# Patient Record
Sex: Female | Born: 1950 | Race: White | Hispanic: No | Marital: Single | Smoking: Former smoker
Health system: Southern US, Community
[De-identification: ages and names within clinical notes are randomized; demographics above are authoritative.]

## PROBLEM LIST (undated history)

## (undated) DIAGNOSIS — S069XAA Unspecified intracranial injury with loss of consciousness status unknown, initial encounter: Secondary | ICD-10-CM

## (undated) DIAGNOSIS — R479 Unspecified speech disturbances: Secondary | ICD-10-CM

## (undated) DIAGNOSIS — S069X9A Unspecified intracranial injury with loss of consciousness of unspecified duration, initial encounter: Secondary | ICD-10-CM

## (undated) HISTORY — PX: SPLENECTOMY, TOTAL: SHX788

---

## 1990-07-27 HISTORY — PX: BREAST EXCISIONAL BIOPSY: SUR124

## 2004-08-01 ENCOUNTER — Emergency Department: Payer: Self-pay | Admitting: Internal Medicine

## 2004-08-25 ENCOUNTER — Ambulatory Visit: Payer: Self-pay | Admitting: Internal Medicine

## 2005-10-21 ENCOUNTER — Ambulatory Visit: Payer: Self-pay | Admitting: Internal Medicine

## 2006-10-25 ENCOUNTER — Ambulatory Visit: Payer: Self-pay | Admitting: Internal Medicine

## 2007-11-03 ENCOUNTER — Ambulatory Visit: Payer: Self-pay | Admitting: Internal Medicine

## 2008-11-28 ENCOUNTER — Ambulatory Visit: Payer: Self-pay | Admitting: Internal Medicine

## 2009-12-24 ENCOUNTER — Ambulatory Visit: Payer: Self-pay | Admitting: Internal Medicine

## 2011-03-03 ENCOUNTER — Ambulatory Visit: Payer: Self-pay | Admitting: Internal Medicine

## 2011-08-15 ENCOUNTER — Emergency Department: Payer: Self-pay | Admitting: Emergency Medicine

## 2012-03-09 ENCOUNTER — Ambulatory Visit: Payer: Self-pay | Admitting: Internal Medicine

## 2013-03-21 ENCOUNTER — Ambulatory Visit: Payer: Self-pay | Admitting: Internal Medicine

## 2014-01-09 ENCOUNTER — Observation Stay: Payer: Self-pay | Admitting: Internal Medicine

## 2014-01-09 LAB — DRUG SCREEN, URINE
Amphetamines, Ur Screen: NEGATIVE (ref ?–1000)
BARBITURATES, UR SCREEN: NEGATIVE (ref ?–200)
Benzodiazepine, Ur Scrn: POSITIVE (ref ?–200)
CANNABINOID 50 NG, UR ~~LOC~~: NEGATIVE (ref ?–50)
COCAINE METABOLITE, UR ~~LOC~~: NEGATIVE (ref ?–300)
MDMA (ECSTASY) UR SCREEN: NEGATIVE (ref ?–500)
Methadone, Ur Screen: NEGATIVE (ref ?–300)
Opiate, Ur Screen: NEGATIVE (ref ?–300)
PHENCYCLIDINE (PCP) UR S: NEGATIVE (ref ?–25)
TRICYCLIC, UR SCREEN: NEGATIVE (ref ?–1000)

## 2014-01-09 LAB — COMPREHENSIVE METABOLIC PANEL
ALK PHOS: 70 U/L
ALT: 30 U/L (ref 12–78)
Albumin: 3.6 g/dL (ref 3.4–5.0)
Anion Gap: 8 (ref 7–16)
BUN: 11 mg/dL (ref 7–18)
Bilirubin,Total: 0.5 mg/dL (ref 0.2–1.0)
CALCIUM: 8.9 mg/dL (ref 8.5–10.1)
CHLORIDE: 105 mmol/L (ref 98–107)
Co2: 21 mmol/L (ref 21–32)
Creatinine: 0.76 mg/dL (ref 0.60–1.30)
EGFR (Non-African Amer.): >60
GLUCOSE: 86 mg/dL (ref 65–99)
Osmolality: 267 (ref 275–301)
POTASSIUM: 4.2 mmol/L (ref 3.5–5.1)
SGOT(AST): 42 U/L — ABNORMAL HIGH (ref 15–37)
Sodium: 134 mmol/L — ABNORMAL LOW (ref 136–145)
Total Protein: 7.7 g/dL (ref 6.4–8.2)

## 2014-01-09 LAB — TROPONIN I: Troponin-I: <0.02 ng/mL

## 2014-01-09 LAB — URINALYSIS, COMPLETE
BILIRUBIN, UR: NEGATIVE
Bacteria: NONE SEEN
Blood: NEGATIVE
GLUCOSE, UR: NEGATIVE mg/dL (ref 0–75)
KETONE: NEGATIVE
Leukocyte Esterase: NEGATIVE
Nitrite: NEGATIVE
Ph: 7 (ref 4.5–8.0)
Protein: NEGATIVE
RBC,UR: <1 /HPF (ref 0–5)
Specific Gravity: 1.008 (ref 1.003–1.030)
Squamous Epithelial: 1
WBC UR: <1 /HPF (ref 0–5)

## 2014-01-09 LAB — CBC
HCT: 44.7 % (ref 35.0–47.0)
HGB: 14.5 g/dL (ref 12.0–16.0)
MCH: 31.6 pg (ref 26.0–34.0)
MCHC: 32.5 g/dL (ref 32.0–36.0)
MCV: 97 fL (ref 80–100)
PLATELETS: 303 10*3/uL (ref 150–440)
RBC: 4.6 10*6/uL (ref 3.80–5.20)
RDW: 13.4 % (ref 11.5–14.5)
WBC: 9.7 10*3/uL (ref 3.6–11.0)

## 2014-01-10 LAB — CBC WITH DIFFERENTIAL/PLATELET
Basophil #: 0.1 10*3/uL (ref 0.0–0.1)
Basophil %: 0.9 %
Eosinophil #: 0.2 10*3/uL (ref 0.0–0.7)
Eosinophil %: 2.9 %
HCT: 41.9 % (ref 35.0–47.0)
HGB: 14.5 g/dL (ref 12.0–16.0)
Lymphocyte #: 3.4 10*3/uL (ref 1.0–3.6)
Lymphocyte %: 40.8 %
MCH: 33.8 pg (ref 26.0–34.0)
MCHC: 34.6 g/dL (ref 32.0–36.0)
MCV: 98 fL (ref 80–100)
MONO ABS: 0.9 x10 3/mm (ref 0.2–0.9)
Monocyte %: 10.4 %
Neutrophil #: 3.8 10*3/uL (ref 1.4–6.5)
Neutrophil %: 45 %
Platelet: 268 10*3/uL (ref 150–440)
RBC: 4.29 10*6/uL (ref 3.80–5.20)
RDW: 13.3 % (ref 11.5–14.5)
WBC: 8.4 10*3/uL (ref 3.6–11.0)

## 2014-01-10 LAB — MAGNESIUM: Magnesium: 2.2 mg/dL

## 2014-01-10 LAB — BASIC METABOLIC PANEL
ANION GAP: 6 — AB (ref 7–16)
BUN: 12 mg/dL (ref 7–18)
CALCIUM: 9.8 mg/dL (ref 8.5–10.1)
CHLORIDE: 104 mmol/L (ref 98–107)
Co2: 29 mmol/L (ref 21–32)
Creatinine: 0.83 mg/dL (ref 0.60–1.30)
EGFR (African American): >60
EGFR (Non-African Amer.): >60
Glucose: 76 mg/dL (ref 65–99)
Osmolality: 276 (ref 275–301)
Potassium: 4.1 mmol/L (ref 3.5–5.1)
SODIUM: 139 mmol/L (ref 136–145)

## 2014-01-10 LAB — LIPID PANEL
Cholesterol: 225 mg/dL — ABNORMAL HIGH (ref 0–200)
HDL Cholesterol: 76 mg/dL — ABNORMAL HIGH (ref 40–60)
Ldl Cholesterol, Calc: 133 mg/dL — ABNORMAL HIGH (ref 0–100)
TRIGLYCERIDES: 82 mg/dL (ref 0–200)
VLDL CHOLESTEROL, CALC: 16 mg/dL (ref 5–40)

## 2014-03-13 ENCOUNTER — Ambulatory Visit: Payer: Self-pay | Admitting: Otolaryngology

## 2014-04-10 ENCOUNTER — Ambulatory Visit: Payer: Self-pay | Admitting: Internal Medicine
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
## 2014-12-07 ENCOUNTER — Encounter: Payer: Self-pay | Admitting: *Deleted

## 2014-12-07 ENCOUNTER — Emergency Department
Admission: EM | Admit: 2014-12-07 | Discharge: 2014-12-07 | Disposition: A | Discharge status: Home / Self Care | Payer: Medicare Other | Attending: Emergency Medicine | Admitting: Emergency Medicine

## 2014-12-07 ENCOUNTER — Emergency Department: Payer: Medicare Other

## 2014-12-07 DIAGNOSIS — J189 Pneumonia, unspecified organism: Secondary | ICD-10-CM

## 2014-12-07 DIAGNOSIS — R079 Chest pain, unspecified: Secondary | ICD-10-CM | POA: Diagnosis present

## 2014-12-07 DIAGNOSIS — J159 Unspecified bacterial pneumonia: Secondary | ICD-10-CM | POA: Insufficient documentation

## 2014-12-07 DIAGNOSIS — Z87891 Personal history of nicotine dependence: Secondary | ICD-10-CM | POA: Diagnosis not present

## 2014-12-07 DIAGNOSIS — D72829 Elevated white blood cell count, unspecified: Secondary | ICD-10-CM | POA: Diagnosis not present

## 2014-12-07 HISTORY — DX: Unspecified speech disturbances: R47.9

## 2014-12-07 LAB — COMPREHENSIVE METABOLIC PANEL
ALT: 18 U/L (ref 14–54)
ANION GAP: 9 (ref 5–15)
AST: 28 U/L (ref 15–41)
Albumin: 4 g/dL (ref 3.5–5.0)
Alkaline Phosphatase: 72 U/L (ref 38–126)
BUN: 11 mg/dL (ref 6–20)
CHLORIDE: 98 mmol/L — AB (ref 101–111)
CO2: 31 mmol/L (ref 22–32)
Calcium: 9.6 mg/dL (ref 8.9–10.3)
Creatinine, Ser: 0.67 mg/dL (ref 0.44–1.00)
Glucose, Bld: 101 mg/dL — ABNORMAL HIGH (ref 65–99)
POTASSIUM: 3.7 mmol/L (ref 3.5–5.1)
Sodium: 138 mmol/L (ref 135–145)
Total Bilirubin: 0.4 mg/dL (ref 0.3–1.2)
Total Protein: 8.1 g/dL (ref 6.5–8.1)

## 2014-12-07 LAB — CBC
HCT: 40.9 % (ref 35.0–47.0)
Hemoglobin: 13.4 g/dL (ref 12.0–16.0)
MCH: 30.8 pg (ref 26.0–34.0)
MCHC: 32.8 g/dL (ref 32.0–36.0)
MCV: 94 fL (ref 80.0–100.0)
Platelets: 335 10*3/uL (ref 150–440)
RBC: 4.35 MIL/uL (ref 3.80–5.20)
RDW: 13.4 % (ref 11.5–14.5)
WBC: 13.3 10*3/uL — AB (ref 3.6–11.0)

## 2014-12-07 LAB — TROPONIN I

## 2014-12-07 MED ORDER — LEVOFLOXACIN 500 MG PO TABS: 500.0000 mg | ORAL_TABLET | Freq: Every day | ORAL | Status: AC

## 2014-12-07 MED ORDER — LEVOFLOXACIN 500 MG PO TABS
500.0000 mg | ORAL_TABLET | Freq: Once | ORAL | Status: AC
  Administered 2014-12-07: 500 mg via ORAL

## 2014-12-07 MED ORDER — LEVOFLOXACIN 500 MG PO TABS
ORAL_TABLET | ORAL | Status: AC
  Administered 2014-12-07: 500 mg via ORAL
  Filled 2014-12-07: qty 1

## 2014-12-07 NOTE — ED Notes (Signed)
Pt arrives via EMS from [facility] with complaints of chest pain, pt states the pain started under her right breast, pt deneis any SOB, nausea or cardiac hx

## 2014-12-07 NOTE — Discharge Instructions (Signed)

## 2014-12-07 NOTE — ED Provider Notes (Signed)
[facility] Emergency Department Provider Note  ____________________________________________  Time seen: 2:20 PM  I have reviewed the triage vital signs and the nursing notes.   HISTORY  Chief Complaint Chest Pain    HPI Brittany [name] is a [age] y.o. female who presents with complaints of right breast and chest discomfort since this morning. Notes the pain is mild and is aching in nature. she reports a distant history of smoking and does complain of a mild cough. No fevers chills. No shortness of breath. No radiation. She has never had this before.    Past Medical History  Diagnosis Date  . Speech impairment     There are no active problems to display for this patient.   Past Surgical History  Procedure Laterality Date  . Splenectomy, total      No current outpatient prescriptions on file.  Allergies Sulfa antibiotics  History reviewed. No pertinent family history.  Social History History  Substance Use Topics  . Smoking status: Former Games developermoker  . Smokeless tobacco: Not on file  . Alcohol Use: Not on file    Review of Systems  Constitutional: Negative for fever. Eyes: Negative for visual changes. ENT: Negative for sore throat. Cardiovascular: Positive for chest pain Respiratory: Negative for shortness of breath. Gastrointestinal: Negative for abdominal pain, vomiting and diarrhea. Genitourinary: Negative for dysuria. Musculoskeletal: Negative for back pain. Skin: Negative for rash. Neurological: Negative for headaches, focal weakness or numbness.   10-point ROS otherwise negative.  ____________________________________________   PHYSICAL EXAM:  VITAL SIGNS: ED Triage Vitals  Enc Vitals Group     BP 12/07/14 1407 114/69 mmHg     Pulse Rate 12/07/14 1407 101     Resp 12/07/14 1407 20     Temp 12/07/14 1407 98.7 F (37.1 C)     Temp Source 12/07/14 1407 Oral     SpO2 12/07/14 1353 93 %     Weight 12/07/14 1407 144 lb  (65.318 kg)     Height 12/07/14 1407 5\' 3"  (1.6 m)     Head Cir --      Peak Flow --      Pain Score 12/07/14 1410 6     Pain Loc --      Pain Edu? --      Excl. in GC? --      Constitutional: Alert and oriented. Well appearing and in no distress. Eyes: Conjunctivae are normal. PERRL. Normal extraocular movements. ENT   Head: Normocephalic and atraumatic.   Nose: No congestion/rhinnorhea.   Mouth/Throat: Mucous membranes are moist. Patient with chronic speech impairment   Neck: No stridor. Hematological/Lymphatic/Immunilogical: No cervical lymphadenopathy. Cardiovascular: Normal rate, regular rhythm. Normal and symmetric distal pulses are present in all extremities. No murmurs, rubs, or gallops. Respiratory: Normal respiratory effort without tachypnea nor retractions. Breath sounds are clear and equal bilaterally. No wheezes/rales/rhonchi. Gastrointestinal: Soft and nontender. No distention. There is no CVA tenderness. Genitourinary: deferred Musculoskeletal: Nontender with normal range of motion in all extremities. No joint effusions.  No lower extremity tenderness nor edema. Neurologic:  Normal speech and language. No gross focal neurologic deficits are appreciated. Speech is normal.  Skin:  Skin is warm, dry and intact. No rash noted. Psychiatric: Mood and affect are normal. Speech and behavior are normal. Patient exhibits appropriate insight and judgment.  ____________________________________________    LABS (pertinent positives/negatives)  Mildly elevated white blood cell count at 13.3 otherwise normal labs  ____________________________________________   EKG   Date: 12/07/2014  Rate: 96  Rhythm: normal sinus rhythm  QRS Axis: normal  Intervals: normal  ST/T Wave abnormalities: normal  Conduction Disutrbances: none  Narrative Interpretation: unremarkable      ____________________________________________    RADIOLOGY  Chest x-ray concerning for  right-sided pneumonia  ____________________________________________   PROCEDURES  Procedure(s) performed: None  Critical Care performed: None  ____________________________________________   INITIAL IMPRESSION / ASSESSMENT AND PLAN / ED COURSE  Pertinent labs & imaging results that were available during my care of the patient were reviewed by me and considered in my medical decision making (see chart for details).  Patient in no acute distress. Pleasant and reports pain is very mild. History of present illness not insistent with ACS given the location of pain and normal EKG. We will check labs as well.  ____________________________________________ ----------------------------------------- 4:06 PM on 12/07/2014 ----------------------------------------- X-ray consistent with right-sided pneumonia which is also the site where the patient is having discomfort. She is 94-95% and above on room air and she is not tachycardic. We will treat her with by mouth antibiotics. She is in assisted living and family reports they will check on her daily. She will follow-up with her PCP Dr. Hyacinth MeekerMiller. She knows to return if any shortness of breath or worsening symptoms   FINAL CLINICAL IMPRESSION(S) / ED DIAGNOSES  Final diagnoses:  Community acquired pneumonia     Jene Everyobert Orley Lawry, MD 12/07/14 1939

## 2015-01-22 ENCOUNTER — Other Ambulatory Visit: Payer: Self-pay | Admitting: Internal Medicine

## 2015-01-22 DIAGNOSIS — Z1231 Encounter for screening mammogram for malignant neoplasm of breast: Secondary | ICD-10-CM

## 2015-04-16 ENCOUNTER — Ambulatory Visit
Admission: RE | Admit: 2015-04-16 | Discharge: 2015-04-16 | Disposition: A | Discharge status: Home / Self Care | Payer: Medicare Other | Source: Ambulatory Visit | Attending: Internal Medicine | Admitting: Internal Medicine

## 2015-04-16 ENCOUNTER — Other Ambulatory Visit: Payer: Self-pay | Admitting: Internal Medicine

## 2015-04-16 DIAGNOSIS — Z1231 Encounter for screening mammogram for malignant neoplasm of breast: Secondary | ICD-10-CM

## 2016-03-25 ENCOUNTER — Other Ambulatory Visit: Payer: Self-pay | Admitting: Internal Medicine

## 2016-03-25 DIAGNOSIS — Z1231 Encounter for screening mammogram for malignant neoplasm of breast: Secondary | ICD-10-CM

## 2016-04-21 ENCOUNTER — Ambulatory Visit: Payer: Medicare Other

## 2016-05-04 ENCOUNTER — Other Ambulatory Visit: Payer: Self-pay | Admitting: Internal Medicine

## 2016-05-04 ENCOUNTER — Ambulatory Visit
Admission: RE | Admit: 2016-05-04 | Discharge: 2016-05-04 | Disposition: A | Discharge status: Home / Self Care | Payer: Medicare Other | Source: Ambulatory Visit | Attending: Internal Medicine | Admitting: Internal Medicine

## 2016-05-04 DIAGNOSIS — Z1231 Encounter for screening mammogram for malignant neoplasm of breast: Secondary | ICD-10-CM

## 2017-03-01 ENCOUNTER — Other Ambulatory Visit: Payer: Self-pay | Admitting: Psychiatry

## 2017-03-01 DIAGNOSIS — Z1231 Encounter for screening mammogram for malignant neoplasm of breast: Secondary | ICD-10-CM

## 2017-05-11 ENCOUNTER — Ambulatory Visit
Admission: RE | Admit: 2017-05-11 | Discharge: 2017-05-11 | Disposition: A | Discharge status: Home / Self Care | Payer: Medicare Other | Source: Ambulatory Visit | Attending: Psychiatry | Admitting: Psychiatry

## 2017-05-11 DIAGNOSIS — Z1231 Encounter for screening mammogram for malignant neoplasm of breast: Secondary | ICD-10-CM | POA: Diagnosis not present

## 2017-06-07 ENCOUNTER — Other Ambulatory Visit: Payer: Self-pay | Admitting: Otolaryngology

## 2017-06-07 DIAGNOSIS — R1312 Dysphagia, oropharyngeal phase: Secondary | ICD-10-CM

## 2017-06-28 ENCOUNTER — Ambulatory Visit
Admission: RE | Admit: 2017-06-28 | Discharge: 2017-06-28 | Disposition: A | Discharge status: Home / Self Care | Payer: Medicare Other | Source: Ambulatory Visit | Attending: Otolaryngology | Admitting: Otolaryngology

## 2017-06-28 DIAGNOSIS — R1312 Dysphagia, oropharyngeal phase: Secondary | ICD-10-CM | POA: Diagnosis not present

## 2017-06-28 NOTE — Therapy (Signed)
[facility] [address] [place], [place], [address] Phone: [phone]   Fax:     Modified Barium Swallow  Patient Details  Name: Brittany [name] MRN: [id number] Date of Birth: Dec 23, 1950 No Data Recorded  Encounter Date: 06/28/2017  End of Session - 06/28/17 1522    Visit Number  1    Number of Visits  1    Date for SLP Re-Evaluation  06/28/17    SLP Start Time  1222    SLP Stop Time   1315    SLP Time Calculation (min)  53 min    Activity Tolerance  Patient tolerated treatment well       Past Medical History:  Diagnosis Date  . Speech impairment     Past Surgical History:  Procedure Laterality Date  . BREAST EXCISIONAL BIOPSY Left 1992   neg  . SPLENECTOMY, TOTAL      There were no vitals filed for this visit.   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  She has been on thickened liquids (nectar-thick).  Chief complaint: Increased choking/coughing at meals.  The patient and her caregiver are not able to identify specific circumstances of the increase in choking/coughing, but seem to indicate more difficulty with foods than the thickened liquid.     Objective:  Radiological Procedure: A videoflouroscopic evaluation of oral-preparatory, reflex initiation, and pharyngeal phases of the swallow was performed; as well as a screening of the upper esophageal phase.  I. POSTURE: Upright in MBS chair  II. VIEW: Lateral  III. COMPENSATORY STRATEGIES: Dry, follow up swallow aids oral and pharyngeal clearance  IV. BOLUSES ADMINISTERED:   Thin Liquid: 1 teaspoon presentation   Nectar-thick Liquid: 2 self-administered cup rim sips   Honey-thick Liquid: DNT   Puree: 2 teaspoon presentations   Mechanical Soft: 1/4 graham cracker in applesauce  V. RESULTS OF  EVALUATION: A. ORAL PREPARATORY PHASE: (The lips, tongue, and velum are observed for strength and coordination)       **Overall Severity Rating: Moderate; slow disorganized lingual movement, mild oral residue post swallow (on back-to-base of tongue)  B. SWALLOW INITIATION/REFLEX: (The reflex is normal if "triggered" by the time the bolus reached the base of the tongue)  **Overall Severity Rating: Moderate-severe; triggers at the pyriform sinuses  C. PHARYNGEAL PHASE: (Pharyngeal function is normal if the bolus shows rapid, smooth, and continuous transit through the pharynx and there is no pharyngeal residue after the swallow)  **Overall Severity Rating: Moderate; decreased hyolaryngeal excursion, absent epiglottic inversion, reduced tongue base retraction, and mild vallecular residue post swallow.    D. LARYNGEAL PENETRATION: (Material entering into the laryngeal inlet/vestibule but not aspirated) None with solids or nectar-thick liquid  E. ASPIRATION: Immediate, audible aspiration of thin liquid, no observed aspiration of solids or nectar-thick liquid  F. ESOPHAGEAL PHASE: (Screening of the upper esophagus): Boluses appeared to move slowly through the cervical esophagus  ASSESSMENT: This 66 year old woman; S/P TBI in the 1980s with resultant oropharyngeal dysphagia and currently on thickened liquids; is presenting with moderate oropharyngeal dysphagia characterized by slow/disorganized oral transfer, oral residue post swallow, delayed pharyngeal swallow initiation, decreased hyolaryngeal excursion, absent epiglottic inversion, reduced tongue base retraction, and mild vallecular residue post swallow.  There is no observed laryngeal penetration or aspiration with nectar-thick liquid or solids.  She coughed X1 without observed airway compromise.  The  patient had frank, audible aspiration of thin liquid before a swallow was initiated.  This study supports a mechanical soft diet with nectar-thick liquid.   A cough is not conclusive for aspiration, as she coughed with a solid bolus despite no airway compromise.  The patient and her caregiver are not able to clearly state what correlates to coughing while eating her meals.  She may be taking too much at one time, getting distracted, need to alternate liquids / solids, need softer/moister foods, need to eat smaller, more frequent meals, etc.  Recommend that the patient have a clinical swallowing evaluation, in her usual setting, to determine appropriate diet consistency and swallowing guidelines.  PLAN/RECOMMENDATIONS:   A. Diet: Regular (soften and moisten as needed) with nectar-thick liquid   B. Swallowing Precautions: Reduce distraction, small bites/sips, alternate solids and liquids   C. Recommended consultation to GI, patient may have esophageal motility changes   D. Therapy recommendations: clinical swallow evaluation in her usual setting   E. Results and recommendations were with the patient and her caregiver immediately following the study and the final report routed to the referring MD and the patient's PCP.   Patient will benefit from skilled therapeutic intervention in order to improve the following deficits and impairments:   Oropharyngeal dysphagia - Plan: DG OP Swallowing Func-Medicare/Speech Path, DG OP Swallowing Func-Medicare/Speech Path  G-Codes - 06/28/17 1522    Functional Assessment Tool Used  MBSS, clinical judgment    Functional Limitations  Swallowing    Swallow Current Status (Z6109(G8996)  At least 40 percent but less than 60 percent impaired, limited or restricted    Swallow Goal Status (U0454(G8997)  At least 40 percent but less than 60 percent impaired, limited or restricted    Swallow Discharge Status [id number]([id number])  At least 40 percent but less than 60 percent impaired, limited or restricted           Problem List There are no active problems to display for this patient.  [name], MS/CCC- SLP  Leandrew KoyanagiAbernathy,  Susie 06/28/2017, 3:23 PM  [facility] [address] [place], [place], [address] Phone: [phone]   Fax:     Name: Brittany [name] MRN: [id number] Date of Birth: 10/26/1950

## 2018-03-13 ENCOUNTER — Inpatient Hospital Stay
Admission: EM | Admit: 2018-03-13 | Discharge: 2018-03-23 | DRG: 871 | Disposition: A | Discharge status: Home / Self Care | Payer: Medicare Other | Attending: Internal Medicine | Admitting: Internal Medicine

## 2018-03-13 ENCOUNTER — Emergency Department: Payer: Medicare Other

## 2018-03-13 ENCOUNTER — Inpatient Hospital Stay: Payer: Medicare Other

## 2018-03-13 ENCOUNTER — Other Ambulatory Visit: Payer: Self-pay

## 2018-03-13 DIAGNOSIS — Z8249 Family history of ischemic heart disease and other diseases of the circulatory system: Secondary | ICD-10-CM | POA: Diagnosis not present

## 2018-03-13 DIAGNOSIS — Z09 Encounter for follow-up examination after completed treatment for conditions other than malignant neoplasm: Secondary | ICD-10-CM

## 2018-03-13 DIAGNOSIS — J869 Pyothorax without fistula: Secondary | ICD-10-CM | POA: Diagnosis not present

## 2018-03-13 DIAGNOSIS — Z882 Allergy status to sulfonamides status: Secondary | ICD-10-CM | POA: Diagnosis not present

## 2018-03-13 DIAGNOSIS — Z9081 Acquired absence of spleen: Secondary | ICD-10-CM

## 2018-03-13 DIAGNOSIS — Z87891 Personal history of nicotine dependence: Secondary | ICD-10-CM | POA: Diagnosis not present

## 2018-03-13 DIAGNOSIS — G249 Dystonia, unspecified: Secondary | ICD-10-CM | POA: Diagnosis present

## 2018-03-13 DIAGNOSIS — R1310 Dysphagia, unspecified: Secondary | ICD-10-CM | POA: Diagnosis present

## 2018-03-13 DIAGNOSIS — A419 Sepsis, unspecified organism: Secondary | ICD-10-CM | POA: Diagnosis present

## 2018-03-13 DIAGNOSIS — G248 Other dystonia: Secondary | ICD-10-CM | POA: Diagnosis not present

## 2018-03-13 DIAGNOSIS — J181 Lobar pneumonia, unspecified organism: Secondary | ICD-10-CM | POA: Diagnosis present

## 2018-03-13 DIAGNOSIS — R479 Unspecified speech disturbances: Secondary | ICD-10-CM | POA: Diagnosis not present

## 2018-03-13 DIAGNOSIS — Z978 Presence of other specified devices: Secondary | ICD-10-CM | POA: Diagnosis not present

## 2018-03-13 DIAGNOSIS — J189 Pneumonia, unspecified organism: Secondary | ICD-10-CM

## 2018-03-13 DIAGNOSIS — Y95 Nosocomial condition: Secondary | ICD-10-CM | POA: Diagnosis present

## 2018-03-13 DIAGNOSIS — Z993 Dependence on wheelchair: Secondary | ICD-10-CM | POA: Diagnosis not present

## 2018-03-13 DIAGNOSIS — R0602 Shortness of breath: Secondary | ICD-10-CM | POA: Diagnosis present

## 2018-03-13 DIAGNOSIS — J90 Pleural effusion, not elsewhere classified: Secondary | ICD-10-CM | POA: Diagnosis not present

## 2018-03-13 DIAGNOSIS — R0902 Hypoxemia: Secondary | ICD-10-CM | POA: Diagnosis present

## 2018-03-13 DIAGNOSIS — Z87820 Personal history of traumatic brain injury: Secondary | ICD-10-CM | POA: Diagnosis not present

## 2018-03-13 HISTORY — DX: Unspecified intracranial injury with loss of consciousness of unspecified duration, initial encounter: S06.9X9A

## 2018-03-13 HISTORY — DX: Unspecified intracranial injury with loss of consciousness status unknown, initial encounter: S06.9XAA

## 2018-03-13 LAB — COMPREHENSIVE METABOLIC PANEL
ALBUMIN: 3.4 g/dL — AB (ref 3.5–5.0)
ALK PHOS: 96 U/L (ref 38–126)
ALT: 31 U/L (ref 0–44)
ANION GAP: 9 (ref 5–15)
AST: 37 U/L (ref 15–41)
BUN: 10 mg/dL (ref 8–23)
CALCIUM: 9.3 mg/dL (ref 8.9–10.3)
CO2: 27 mmol/L (ref 22–32)
Chloride: 103 mmol/L (ref 98–111)
Creatinine, Ser: 0.8 mg/dL (ref 0.44–1.00)
GFR calc Af Amer: >60 mL/min (ref >60)
GFR calc non Af Amer: >60 mL/min (ref >60)
GLUCOSE: 105 mg/dL — AB (ref 70–99)
Potassium: 4.3 mmol/L (ref 3.5–5.1)
SODIUM: 139 mmol/L (ref 135–145)
Total Bilirubin: 0.5 mg/dL (ref 0.3–1.2)
Total Protein: 7.6 g/dL (ref 6.5–8.1)

## 2018-03-13 LAB — CBC
HCT: 42.7 % (ref 35.0–47.0)
HEMATOCRIT: 40 % (ref 35.0–47.0)
HEMOGLOBIN: 14.4 g/dL (ref 12.0–16.0)
HEMOGLOBIN: 13.6 g/dL (ref 12.0–16.0)
MCH: 31.9 pg (ref 26.0–34.0)
MCH: 32 pg (ref 26.0–34.0)
MCHC: 33.8 g/dL (ref 32.0–36.0)
MCHC: 34.1 g/dL (ref 32.0–36.0)
MCV: 94.4 fL (ref 80.0–100.0)
MCV: 93.7 fL (ref 80.0–100.0)
Platelets: 463 10*3/uL — ABNORMAL HIGH (ref 150–440)
Platelets: 494 10*3/uL — ABNORMAL HIGH (ref 150–440)
RBC: 4.52 MIL/uL (ref 3.80–5.20)
RBC: 4.27 MIL/uL (ref 3.80–5.20)
RDW: 12.8 % (ref 11.5–14.5)
RDW: 12.9 % (ref 11.5–14.5)
WBC: 16.6 10*3/uL — ABNORMAL HIGH (ref 3.6–11.0)
WBC: 21.1 10*3/uL — AB (ref 3.6–11.0)

## 2018-03-13 LAB — LACTIC ACID, PLASMA: LACTIC ACID, VENOUS: 1.4 mmol/L (ref 0.5–1.9)

## 2018-03-13 LAB — CREATININE, SERUM: Creatinine, Ser: 0.74 mg/dL (ref 0.44–1.00)

## 2018-03-13 LAB — MRSA PCR SCREENING: MRSA by PCR: NEGATIVE

## 2018-03-13 LAB — TROPONIN I

## 2018-03-13 LAB — LIPASE, BLOOD: Lipase: 30 U/L (ref 11–51)

## 2018-03-13 MED ORDER — IOPAMIDOL (ISOVUE-370) INJECTION 76%
75.0000 mL | Freq: Once | INTRAVENOUS | Status: AC | PRN
  Administered 2018-03-13: 75 mL via INTRAVENOUS

## 2018-03-13 MED ORDER — DEXTROSE 5 % IV SOLN
250.0000 mg | INTRAVENOUS | Status: DC
  Administered 2018-03-13: 250 mg via INTRAVENOUS
  Filled 2018-03-13 (×3): qty 250

## 2018-03-13 MED ORDER — DOCUSATE SODIUM 100 MG PO CAPS
100.0000 mg | ORAL_CAPSULE | Freq: Two times a day (BID) | ORAL | Status: DC | PRN
  Administered 2018-03-19: 100 mg via ORAL
  Filled 2018-03-13: qty 1

## 2018-03-13 MED ORDER — VANCOMYCIN HCL IN DEXTROSE 1-5 GM/200ML-% IV SOLN
1000.0000 mg | Freq: Once | INTRAVENOUS | Status: AC
  Administered 2018-03-13: 1000 mg via INTRAVENOUS
  Filled 2018-03-13: qty 200

## 2018-03-13 MED ORDER — ACETAMINOPHEN 325 MG PO TABS
650.0000 mg | ORAL_TABLET | Freq: Once | ORAL | Status: AC
  Administered 2018-03-13: 650 mg via ORAL
  Filled 2018-03-13: qty 2

## 2018-03-13 MED ORDER — SODIUM CHLORIDE 0.9 % IV SOLN
1.0000 g | INTRAVENOUS | Status: DC
  Administered 2018-03-13: 1 g via INTRAVENOUS
  Filled 2018-03-13 (×2): qty 10

## 2018-03-13 MED ORDER — HEPARIN SODIUM (PORCINE) 5000 UNIT/ML IJ SOLN
5000.0000 [IU] | Freq: Three times a day (TID) | INTRAMUSCULAR | Status: DC
  Administered 2018-03-13 – 2018-03-16 (×5): 5000 [IU] via SUBCUTANEOUS
  Filled 2018-03-13 (×6): qty 1

## 2018-03-13 MED ORDER — GUAIFENESIN 100 MG/5ML PO SOLN
10.0000 mL | Freq: Four times a day (QID) | ORAL | Status: DC | PRN
  Administered 2018-03-16 – 2018-03-22 (×3): 200 mg via ORAL
  Filled 2018-03-13 (×5): qty 10

## 2018-03-13 MED ORDER — IBUPROFEN 600 MG PO TABS
600.0000 mg | ORAL_TABLET | ORAL | Status: AC
  Administered 2018-03-13: 600 mg via ORAL
  Filled 2018-03-13: qty 1

## 2018-03-13 MED ORDER — SODIUM CHLORIDE 0.9 % IV SOLN
INTRAVENOUS | Status: DC
  Administered 2018-03-13 – 2018-03-14 (×3): via INTRAVENOUS

## 2018-03-13 MED ORDER — SODIUM CHLORIDE 0.9 % IV BOLUS
500.0000 mL | Freq: Once | INTRAVENOUS | Status: AC
  Administered 2018-03-13: 500 mL via INTRAVENOUS

## 2018-03-13 MED ORDER — SODIUM CHLORIDE 0.9 % IV SOLN
2.0000 g | Freq: Once | INTRAVENOUS | Status: AC
  Administered 2018-03-13: 2 g via INTRAVENOUS
  Filled 2018-03-13: qty 2

## 2018-03-13 MED ORDER — OXYCODONE-ACETAMINOPHEN 5-325 MG PO TABS
1.0000 | ORAL_TABLET | Freq: Four times a day (QID) | ORAL | Status: DC | PRN
  Administered 2018-03-13 – 2018-03-17 (×7): 1 via ORAL
  Filled 2018-03-13 (×7): qty 1

## 2018-03-13 NOTE — ED Triage Notes (Signed)
Patient coming [facility] from [facility]. Patient c/o RUQ abdominal pain, radiating below right breast. Patient was diagnosed with pneumonia Thursday and is on Levaquin.
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
## 2018-03-13 NOTE — H&P (Signed)
[facility] [place] [facility]   PATIENT NAME: Brittany PancakeSharon [name]    MR#:  [id number]  DATE OF BIRTH:  [birth year]  DATE OF ADMISSION:  03/13/2018  PRIMARY CARE PHYSICIAN: Danella PentonMiller, Mark F, MD   REQUESTING/REFERRING PHYSICIAN: Quale  CHIEF COMPLAINT:   Chief Complaint  Patient presents with  . Abdominal Pain    HISTORY OF PRESENT ILLNESS: Brittany [name]  is a 67 y.o. female with a known history of traumatic brain injury and speech impairment, lives in a group home and on pured diet with nectar thick liquids. Was having pneumonia and under treatment with oral Levaquin for last few days but continued to have worsening of cough and right-sided chest pains are sent to emergency room where she is noted to be hypoxic and infiltrate on the lungs, tachycardia. ER physician suggested to admit to hospitalist team because of failure of outpatient treatment.  PAST MEDICAL HISTORY:   Past Medical History:  Diagnosis Date  . Speech impairment   . TBI (traumatic brain injury) (HCC)     PAST SURGICAL HISTORY:  Past Surgical History:  Procedure Laterality Date  . BREAST EXCISIONAL BIOPSY Left 1992   neg  . SPLENECTOMY, TOTAL      SOCIAL HISTORY:  Social History   Tobacco Use  . Smoking status: Former Games developermoker  . Smokeless tobacco: Never Used  Substance Use Topics  . Alcohol use: Not on file    FAMILY HISTORY:  Family History  Problem Relation Age of Onset  . Hypertension Mother   . Breast cancer Neg Hx     DRUG ALLERGIES:  Allergies  Allergen Reactions  . Sulfa Antibiotics Nausea Only and Rash    REVIEW OF SYSTEMS:   CONSTITUTIONAL: No fever, fatigue or weakness.  EYES: No blurred or double vision.  EARS, NOSE, AND THROAT: No tinnitus or ear pain.  RESPIRATORY: She have cough, shortness of breath, no wheezing or hemoptysis.  CARDIOVASCULAR: No chest pain, orthopnea, edema.  GASTROINTESTINAL: No nausea, vomiting, diarrhea or abdominal pain.   GENITOURINARY: No dysuria, hematuria.  ENDOCRINE: No polyuria, nocturia,  HEMATOLOGY: No anemia, easy bruising or bleeding SKIN: No rash or lesion. MUSCULOSKELETAL: No joint pain or arthritis.   NEUROLOGIC: No tingling, numbness, weakness.  PSYCHIATRY: No anxiety or depression.   MEDICATIONS AT HOME:  Prior to Admission medications   Medication Sig Start Date End Date Taking? Authorizing Provider  diazepam (VALIUM) 10 MG tablet Take 10 mg by mouth at bedtime as needed.   Yes [provider]  levofloxacin (LEVAQUIN) 500 MG tablet Take 500 mg by mouth daily. 03/10/18 03/20/18 Yes [provider]  pseudoephedrine (SUDAFED) 30 MG tablet Take 60 mg by mouth 2 (two) times daily as needed for congestion.   Yes [provider]      PHYSICAL EXAMINATION:   VITAL SIGNS: Blood pressure 122/62, pulse 93, temperature 98.1 F (36.7 C), temperature source Oral, resp. rate 18, height 5\' 4"  (1.626 m), weight 65 kg, SpO2 95 %.  GENERAL:  67 y.o.-year-old patient lying in the bed with no acute distress.  EYES: Pupils equal, round, reactive to light and accommodation. No scleral icterus. Extraocular muscles intact.  HEENT: Head atraumatic, normocephalic. Oropharynx and nasopharynx clear.  Speech is altered NECK:  Supple, no jugular venous distention. No thyroid enlargement, no tenderness.  LUNGS: Normal breath sounds bilaterally, no wheezing, some crepitation. No use of accessory muscles of respiration.  CARDIOVASCULAR: S1, S2 normal. No murmurs, rubs, or gallops.  ABDOMEN: Soft, nontender,  nondistended. Bowel sounds present. No organomegaly or mass.  EXTREMITIES: No pedal edema, cyanosis, or clubbing.  NEUROLOGIC: Cranial nerves II through XII are intact. Muscle strength 5/5 in all extremities. Sensation intact. Gait not checked.  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 PSYCHIATRIC:  The patient is alert and oriented x 3.  SKIN: No obvious rash, lesion, or ulcer.   LABORATORY PANEL:   CBC Recent Labs  Lab 03/13/18 0616 03/13/18 1107  WBC 16.6* 21.1*  HGB 14.4 13.6  HCT 42.7 40.0  PLT 463* 494*  MCV 94.4 93.7  MCH 31.9 32.0  MCHC 33.8 34.1  RDW 12.8 12.9   ------------------------------------------------------------------------------------------------------------------  Chemistries  Recent Labs  Lab 03/13/18 0616 03/13/18 1107  NA 139  --   K 4.3  --   CL 103  --   CO2 27  --   GLUCOSE 105*  --   BUN 10  --   CREATININE 0.80 0.74  CALCIUM 9.3  --   AST 37  --   ALT 31  --   ALKPHOS 96  --   BILITOT 0.5  --    ------------------------------------------------------------------------------------------------------------------ estimated creatinine clearance is 61.3 mL/min (by C-G formula based on SCr of 0.74 mg/dL). ------------------------------------------------------------------------------------------------------------------ No results for input(s): TSH, T4TOTAL, T3FREE, THYROIDAB in the last 72 hours.  Invalid input(s): FREET3   Coagulation profile No results for input(s): INR, PROTIME in the last 168 hours. ------------------------------------------------------------------------------------------------------------------- No results for input(s): DDIMER in the last 72 hours. -------------------------------------------------------------------------------------------------------------------  Cardiac Enzymes Recent Labs  Lab 03/13/18 0616  TROPONINI <0.03   ------------------------------------------------------------------------------------------------------------------ Invalid input(s): POCBNP  ---------------------------------------------------------------------------------------------------------------  Urinalysis    Component Value Date/Time   COLORURINE Yellow 01/09/2014 1852   APPEARANCEUR Clear 01/09/2014 1852   LABSPEC  1.008 01/09/2014 1852   PHURINE 7.0 01/09/2014 1852   GLUCOSEU Negative 01/09/2014 1852   HGBUR Negative 01/09/2014 1852   BILIRUBINUR Negative 01/09/2014 1852   KETONESUR Negative 01/09/2014 1852   PROTEINUR Negative 01/09/2014 1852   NITRITE Negative 01/09/2014 1852   LEUKOCYTESUR Negative 01/09/2014 1852     RADIOLOGY: Dg Chest 2 View  Result Date: 03/13/2018 CLINICAL DATA:  Initial evaluation for acute right upper quadrant pain, recently diagnosed with pneumonia. EXAM: CHEST - 2 VIEW COMPARISON:  Prior radiograph from 12/07/2014. FINDINGS: Mild cardiomegaly.  Mediastinal silhouette within normal limits. Lungs hypoinflated. Acute right lower lobe infiltrate, most consistent with pneumonia. Probable small bilateral pleural effusions. No pulmonary edema. No pneumothorax. No acute osseous abnormality. IMPRESSION: 1. Acute right lower lobe infiltrate, highly suggestive of pneumonia. 2. Probable small bilateral pleural effusions. Electronically Signed   By: Rise Mu M.D.   On: 03/13/2018 06:50   Ct Angio Chest Pe W And/or Wo Contrast  Result Date: 03/13/2018 CLINICAL DATA:  Cough, shortness of breath, and congestion. EXAM: CT ANGIOGRAPHY CHEST WITH CONTRAST TECHNIQUE: Multidetector CT imaging of the chest was performed using the standard protocol during bolus administration of intravenous contrast. Multiplanar CT image reconstructions and MIPs were obtained to evaluate the vascular anatomy. CONTRAST:  75mL ISOVUE-370 IOPAMIDOL (ISOVUE-370) INJECTION 76% COMPARISON:  Chest x-ray from same day. FINDINGS: Cardiovascular: Satisfactory opacification of the pulmonary arteries to the segmental level. No evidence of pulmonary embolism. Normal heart size. No pericardial effusion. Normal caliber thoracic aorta. No aortic dissection. Mediastinum/Nodes: Prominent mediastinal and right hilar lymph nodes measuring up to 10 mm in short axis, likely reactive. No axillary lymphadenopathy. The  thyroid  gland, trachea, and esophagus demonstrate no significant findings. Lungs/Pleura: Nonenhancing consolidation within the right middle lobe. Small right pleural effusion with adjacent right lower lobe atelectasis. No pleural enhancement. Scattered peripheral centrilobular nodules in the right upper lobe. Subsegmental atelectasis in the lingula and left lower lobe. 6 mm subpleural nodule in the posterior left upper lobe (series 6, image 21) Upper Abdomen: No acute abnormality. Prior splenectomy. 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 Additional adjacent small splenules as well. Musculoskeletal: No chest wall abnormality. No acute or significant osseous findings. Review of the MIP images confirms the above findings. IMPRESSION: 1. Non-enhancing consolidation within the right middle lobe, most consistent with pneumonia given clinical history. Additional scattered peripheral centrilobular nodules in the right upper lobe are likely infectious or inflammatory. Followup PA and lateral chest X-ray is recommended in 3-4 weeks following trial of antibiotic therapy to ensure resolution and exclude underlying malignancy. 2.  No evidence of pulmonary embolism. 3. Small right pleural effusion with adjacent right lower lobe atelectasis. No pleural enhancement. 4. 6 mm left upper lobe nodule. Non-contrast chest CT at 6-12 months is recommended. If the nodule is stable at time of repeat CT, then future CT at 18-24 months (from today's scan) is considered optional for low-risk patients, but is recommended for high-risk patients. This recommendation follows the consensus statement: Guidelines for Management of Incidental Pulmonary Nodules Detected on CT Images: From the Fleischner Society 2017; Radiology 2017; 284:228-243. Electronically Signed   By: Obie DredgeWilliam T Derry M.D.   On: 03/13/2018 10:19    EKG: Orders placed or performed during the  hospital encounter of 03/13/18  . ED EKG  . ED EKG  . EKG 12-Lead  . EKG 12-Lead    IMPRESSION AND PLAN:  *Sepsis due to community-acquired pneumonia IV ceftriaxone and azithromycin for now. ER physician give vancomycin and cefepime, I will check MRSA PCR. Cultures are sent by ER. ER physician also ordered CT scan angiogram to rule out pulmonary embolism which is negative and confirms the pneumonia.  *Tachycardia Due to sepsis, give IV fluids.  *Dysphagia Due to traumatic brain injury, continue her diet as per preadmission.  All the records are reviewed and case discussed with ED provider. Management plans discussed with the patient, family and they are in agreement.  CODE STATUS: full.    Code Status Orders  (From admission, onward)         Start     Ordered   03/13/18 1053  Full code  Continuous     03/13/18 1052        Code Status History    This patient has a current code status but no historical code status.    Advance Directive Documentation     Most Recent Value  Type of Advance Directive  Healthcare Power of Attorney, Living will  Pre-existing out of facility DNR order (yellow form or pink MOST form)  -  "MOST" Form in Place?  -       TOTAL TIME TAKING CARE OF THIS PATIENT: 50 minutes.    Altamese DillingVaibhavkumar Arel Tippen M.D on 03/13/2018   Between 7am to 6pm - Pager - [phone]  After 6pm go to [email or web address] - password [email or web address]  [facility]  Office  [phone]  CC: Primary care physician; Danella PentonMiller, Mark F, MD   Note: This dictation was prepared with Dragon dictation along with smaller phrase technology. Any transcriptional  errors that result from this process are unintentional.
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
## 2018-03-13 NOTE — ED Notes (Signed)
Pt taken to CT.

## 2018-03-13 NOTE — Progress Notes (Signed)
Vanity called ED and talked to Film/video editorodney,unit secretary. He will check on it (wheelchair) and call us back. Awaiting for return call .

## 2018-03-13 NOTE — [place] FL2 (Addendum)
  [place] MEDICAID FL2 LEVEL OF CARE SCREENING TOOL     IDENTIFICATION  Patient Name: Brittany KaufmanSharon S [name] Birthdate: [birth year] Sex: female Admission Date (Current Location): 03/13/2018  [place]   Facility and Address:  [facility], [address], [place], [place] [address]      Provider Number: [id number]  Attending Physician Name and Address:  Altamese DillingVachhani, Vaibhavkumar, *  Relative Name and Phone Number:     Brittany [name] [phone]  Current Level of Care: Hospital Recommended Level of Care: Skilled Nursing Facility Prior Approval Number:    Date Approved/Denied:   PASRR Number:    Discharge Plan: SNF    Current Diagnoses: Patient Active Problem List   Diagnosis Date Noted  . Community acquired pneumonia 03/13/2018  . Pneumonia 03/13/2018    Orientation RESPIRATION BLADDER Height & Weight     Self, Time, Situation, Place    Incontinent Weight: 143 lb 4.8 oz (65 kg) Height:  5\' 4"  (162.6 cm)  BEHAVIORAL SYMPTOMS/MOOD NEUROLOGICAL BOWEL NUTRITION STATUS      Continent    AMBULATORY STATUS COMMUNICATION OF NEEDS Skin   Limited Assist Verbally Normal                       Personal Care Assistance Level of Assistance  Bathing, Feeding, Dressing, Total care Bathing Assistance: Limited assistance Feeding assistance: Independent Dressing Assistance: Limited assistance Total Care Assistance: Limited assistance   Functional Limitations Info  Sight, Hearing, Speech Sight Info: Adequate Hearing Info: Adequate Speech Info: Impaired(TBI- she is verbal but words come out very marbled)    SPECIAL CARE FACTORS FREQUENCY   uses wheel chair has balance issues and TBI  She is on thicken diet-fluids                    Contractures Contractures Info: Not present    Additional Factors Info  Allergies   Allergies Info: Sulpha antibiotics           Current Medications (03/13/2018):  This is the  current hospital active medication list Current Facility-Administered Medications  Medication Dose Route Frequency Provider Last Rate Last Dose  . 0.9 %  sodium chloride infusion   Intravenous Continuous Altamese DillingVachhani, Vaibhavkumar, MD      . azithromycin (ZITHROMAX) 250 mg in dextrose 5 % 125 mL IVPB  250 mg Intravenous Q24H Altamese DillingVachhani, Vaibhavkumar, MD      . cefTRIAXone (ROCEPHIN) 1 g in sodium chloride 0.9 % 100 mL IVPB  1 g Intravenous Q24H Altamese DillingVachhani, Vaibhavkumar, MD         Discharge Medications: Please see discharge summary for a list of discharge medications.  Relevant Imaging Results:  Relevant Lab Results:   Additional Information [id number]  Cheron SchaumannBandi, Prosperity Darrough [name], [place]

## 2018-03-13 NOTE — ED Notes (Signed)
Dr. Fanny BienQuale placed pt on 2 L [place] d/t oxygen being low 90's. Will continue to monitor.

## 2018-03-13 NOTE — ED Provider Notes (Signed)
[facility] Emergency Department Provider Note   ____________________________________________   First MD Initiated Contact with Patient 03/13/18 [phone]     (approximate)  I have reviewed the triage vital signs and the nursing notes.   HISTORY  Chief Complaint Shortness of breath and right lower rib pain   HPI Brittany [name] is a 67 y.o. female reports that she was diagnosed and treated for pneumonia Thursday.  She is had a feeling of congestion, some difficulty breathing, and having increasing pain in the right lower chest.  She reports she thinks this started with a fever.  She has been on Levaquin since Thursday, but symptoms seem to be worsening.  No nausea vomiting.  Reports she still eating and tells me she is not having abdominal pain, the triage note notes having abdominal pain.  She reports rather it feels like pain in her lower chest and along her right lower ribs.  Pain is moderate to severe.  Offered morphine and additional strong pain medications, patient reports she would like to try Tylenol or ibuprofen first.  Past Medical History:  Diagnosis Date  . Speech impairment   . TBI (traumatic brain injury) (HCC)     There are no active problems to display for this patient.   Past Surgical History:  Procedure Laterality Date  . BREAST EXCISIONAL BIOPSY Left 1992   neg  . SPLENECTOMY, TOTAL      Prior to Admission medications   Not on File    Allergies Sulfa antibiotics  Family History  Problem Relation Age of Onset  . Breast cancer Neg Hx     Social History Social History   Tobacco Use  . Smoking status: Former Games developermoker  . Smokeless tobacco: Never Used  Substance Use Topics  . Alcohol use: Not on file  . Drug use: Not on file    Review of Systems Constitutional: Fatigue, reports she just "feeling awful" and thinks she had a fever Eyes: No visual changes. ENT: No sore throat. Cardiovascular: See HPI, all discomfort right  lower rib area Respiratory: See HPI Gastrointestinal: No abdominal pain.  No nausea, no vomiting.  No diarrhea.  No constipation. Genitourinary: Negative for dysuria. Musculoskeletal: Negative for back pain. Skin: Negative for rash. Neurological: Negative for headaches, focal weakness or numbness.    ____________________________________________   PHYSICAL EXAM:  VITAL SIGNS: ED Triage Vitals  Enc Vitals Group     BP 03/13/18 0618 (!) 132/110     Pulse Rate 03/13/18 0618 93     Resp 03/13/18 0618 18     Temp 03/13/18 0618 97.9 F (36.6 C)     Temp Source 03/13/18 0618 Oral     SpO2 03/13/18 0618 97 %     Weight 03/13/18 0614 143 lb 4.8 oz (65 kg)     Height 03/13/18 0614 5\' 4"  (1.626 m)     Head Circumference --      Peak Flow --      Pain Score 03/13/18 0614 10     Pain Loc --      Pain Edu? --      Excl. in GC? --     Constitutional: Alert and oriented.  Moderately ill-appearing.  Pleasant in no distress, but does appear uncomfortable and slightly dyspneic Eyes: Conjunctivae are normal. Head: Atraumatic. Nose: No congestion/rhinnorhea. Mouth/Throat: Mucous membranes are moist. Neck: No stridor.   Cardiovascular: Tachycardic rate, regular rhythm. Grossly normal heart sounds.  Good peripheral circulation.  Heart rate approximately  10 5-1 10 during exam. Respiratory: Mild tachypnea with a rate of about 22.  No retractions. Lungs CTAB except for severely diminished in the right lower base. Gastrointestinal: Soft and nontender. No distention.  Negative Murphy.  No rebounding guarding or peritonitis throughout all quadrants. Musculoskeletal: No lower extremity tenderness nor edema. Neurologic:  Normal speech and language. No gross focal neurologic deficits are appreciated.  Skin:  Skin is warm, dry and intact. No rash noted. Psychiatric: Mood and affect are normal. Speech and behavior are normal.  ____________________________________________   LABS (all labs ordered are  listed, but only abnormal results are displayed)  Labs Reviewed  COMPREHENSIVE METABOLIC PANEL - Abnormal; Notable for the following components:      Result Value   Glucose, Bld 105 (*)    Albumin 3.4 (*)    All other components within normal limits  CBC - Abnormal; Notable for the following components:   WBC 16.6 (*)    Platelets 463 (*)    All other components within normal limits  CULTURE, BLOOD (ROUTINE X 2)  CULTURE, BLOOD (ROUTINE X 2)  LIPASE, BLOOD  TROPONIN I  URINALYSIS, COMPLETE (UACMP) WITH MICROSCOPIC  LACTIC ACID, PLASMA   ____________________________________________  EKG  Reviewed at interpreted 1620 Heart rate 95 QRS 95 QTc 430 Some artifact, appears likely normal sinus rhythm no evidence of acute ischemia ____________________________________________  RADIOLOGY     Chest x-ray result as reported by radiology reviewed by me.  CT scan being performed, hospitalist will follow up on result. ____________________________________________   PROCEDURES  Procedure(s) performed: None  Procedures  Critical Care performed: No  ____________________________________________   INITIAL IMPRESSION / ASSESSMENT AND PLAN / ED COURSE  Pertinent labs & imaging results that were available during my care of the patient were reviewed by me and considered in my medical decision making (see chart for details).  Patient denying abdominal pain on exam without discomfort on exam in the abdomen.  Does have notable right lower rib pain with increasing leukocytosis from her outpatient visit, on antibiotics, and also slightly tachycardic.  She appears slightly unwell with slight tachypnea.  During my examination heart rate 10 5-1 10, respiratory rate 20-22.  Slightly worsened vital signs from the time of triage, have initiated fluid bolus and will broaden her antibiotic coverage given she resides in a healthcare facility.  Clinical Course as of Mar 13 800  Sun Mar 13, 2018    0759 HR 105, will start fluids.    [MQ]    Clinical Course User Index [MQ] Sharyn CreamerQuale, Mark, MD   Will obtain CT angiography to exclude pulmonary embolism given the associated chest discomfort.  Low clinical suspicion of abdominal etiology, suspect right lower lobe pneumonia.  Discussed with Dr. Elisabeth PigeonVachhani the hospitalist, will admit for further care, concern for early sepsis given tachycardia, elevated white count and possible treatment failure on Levaquin.  Hospitalist follow-up on CT angios.   No notable cardiac symptoms.  Admitted for further work-up and evaluation in her hospital service.   ____________________________________________   FINAL CLINICAL IMPRESSION(S) / ED DIAGNOSES  Final diagnoses:  Healthcare-associated pneumonia  Sepsis, due to unspecified organism [facility](HCC)      NEW MEDICATIONS STARTED DURING THIS VISIT:  New Prescriptions   No medications on file     Note:  This document was prepared using Dragon voice recognition software and may include unintentional dictation errors.     Sharyn CreamerQuale, Mark, MD 03/13/18 [phone]

## 2018-03-14 LAB — BASIC METABOLIC PANEL
ANION GAP: 7 (ref 5–15)
BUN: 11 mg/dL (ref 8–23)
CO2: 25 mmol/L (ref 22–32)
Calcium: 8.7 mg/dL — ABNORMAL LOW (ref 8.9–10.3)
Chloride: 107 mmol/L (ref 98–111)
Creatinine, Ser: 0.69 mg/dL (ref 0.44–1.00)
GFR calc Af Amer: >60 mL/min (ref >60)
GLUCOSE: 125 mg/dL — AB (ref 70–99)
POTASSIUM: 3.9 mmol/L (ref 3.5–5.1)
Sodium: 139 mmol/L (ref 135–145)

## 2018-03-14 LAB — URINALYSIS, COMPLETE (UACMP) WITH MICROSCOPIC
BILIRUBIN URINE: NEGATIVE
Bacteria, UA: NONE SEEN
Glucose, UA: NEGATIVE mg/dL
Hgb urine dipstick: NEGATIVE
Ketones, ur: NEGATIVE mg/dL
Leukocytes, UA: NEGATIVE
Nitrite: NEGATIVE
PROTEIN: NEGATIVE mg/dL
Specific Gravity, Urine: 1.017 (ref 1.005–1.030)
pH: 5 (ref 5.0–8.0)

## 2018-03-14 LAB — CBC
HEMATOCRIT: 37.2 % (ref 35.0–47.0)
HEMOGLOBIN: 13.1 g/dL (ref 12.0–16.0)
MCH: 33.1 pg (ref 26.0–34.0)
MCHC: 35.3 g/dL (ref 32.0–36.0)
MCV: 93.9 fL (ref 80.0–100.0)
Platelets: 483 10*3/uL — ABNORMAL HIGH (ref 150–440)
RBC: 3.96 MIL/uL (ref 3.80–5.20)
RDW: 13 % (ref 11.5–14.5)
WBC: 31.1 10*3/uL — ABNORMAL HIGH (ref 3.6–11.0)

## 2018-03-14 MED ORDER — PIPERACILLIN-TAZOBACTAM 3.375 G IVPB
3.3750 g | Freq: Three times a day (TID) | INTRAVENOUS | Status: DC
  Administered 2018-03-14 – 2018-03-18 (×12): 3.375 g via INTRAVENOUS
  Filled 2018-03-14 (×12): qty 50

## 2018-03-14 MED ORDER — ACETAMINOPHEN 325 MG PO TABS
650.0000 mg | ORAL_TABLET | Freq: Four times a day (QID) | ORAL | Status: DC | PRN
  Administered 2018-03-14 – 2018-03-17 (×2): 650 mg via ORAL
  Filled 2018-03-14 (×2): qty 2

## 2018-03-14 NOTE — Progress Notes (Signed)
[employer] - [place] [facility]   PATIENT NAME: Brittany [name]    MR#:  [id number]  DATE OF BIRTH:  1951/05/22  SUBJECTIVE:  CHIEF COMPLAINT:   Chief Complaint  Patient presents with  . Abdominal Pain   Came with pneumonia, have some complains of pain in chest and abdomen.  REVIEW OF SYSTEMS:  CONSTITUTIONAL: No fever, fatigue or weakness.  EYES: No blurred or double vision.  EARS, NOSE, AND THROAT: No tinnitus or ear pain.  RESPIRATORY: No cough, shortness of breath, wheezing or hemoptysis.  CARDIOVASCULAR: have chest pain, orthopnea, edema.  GASTROINTESTINAL: No nausea, vomiting, diarrhea or abdominal pain.  GENITOURINARY: No dysuria, hematuria.  ENDOCRINE: No polyuria, nocturia,  HEMATOLOGY: No anemia, easy bruising or bleeding SKIN: No rash or lesion. MUSCULOSKELETAL: No joint pain or arthritis.   NEUROLOGIC: No tingling, numbness, weakness.  PSYCHIATRY: No anxiety or depression.   ROS  DRUG ALLERGIES:   Allergies  Allergen Reactions  . Sulfa Antibiotics Nausea Only and Rash    VITALS:  Blood pressure 134/66, pulse (!) 140, temperature (!) 100.7 F (38.2 C), temperature source Oral, resp. rate 20, height 5\' 4"  (1.626 m), weight 65 kg, SpO2 96 %.  PHYSICAL EXAMINATION:   GENERAL:  67 y.o.-year-old patient lying in the bed with no acute distress.  EYES: Pupils equal, round, reactive to light and accommodation. No scleral icterus. Extraocular muscles intact.  HEENT: Head atraumatic, normocephalic. Oropharynx and nasopharynx clear.  Speech is altered NECK:  Supple, no jugular venous distention. No thyroid enlargement, no tenderness.  LUNGS: Normal breath sounds bilaterally, no wheezing, some crepitation. No use of accessory muscles of respiration.  CARDIOVASCULAR: S1, S2 normal. No murmurs, rubs, or gallops.  ABDOMEN: Soft, nontender, nondistended. Bowel sounds present. No organomegaly or mass.  EXTREMITIES: No pedal edema, cyanosis, or  clubbing.  NEUROLOGIC: Cranial nerves II through XII are intact. Muscle strength 5/5 in all extremities. Sensation intact. Gait not checked.  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 PSYCHIATRIC: The patient is alert and oriented x 3.  SKIN: No obvious rash, lesion, or ulcer.   Physical Exam LABORATORY PANEL:   CBC Recent Labs  Lab 03/14/18 0531  WBC 31.1*  HGB 13.1  HCT 37.2  PLT 483*   ------------------------------------------------------------------------------------------------------------------  Chemistries  Recent Labs  Lab 03/13/18 0616  03/14/18 0531  NA 139  --  139  K 4.3  --  3.9  CL 103  --  107  CO2 27  --  25  GLUCOSE 105*  --  125*  BUN 10  --  11  CREATININE 0.80   < > 0.69  CALCIUM 9.3  --  8.7*  AST 37  --   --   ALT 31  --   --   ALKPHOS 96  --   --   BILITOT 0.5  --   --    < > = values in this interval not displayed.   ------------------------------------------------------------------------------------------------------------------  Cardiac Enzymes Recent Labs  Lab 03/13/18 0616  TROPONINI <0.03   ------------------------------------------------------------------------------------------------------------------  RADIOLOGY:  Dg Chest 2 View  Result Date: 03/13/2018 CLINICAL DATA:  Initial evaluation for acute right upper quadrant pain, recently diagnosed with pneumonia. EXAM: CHEST - 2 VIEW COMPARISON:  Prior radiograph from 12/07/2014. FINDINGS: Mild cardiomegaly.  Mediastinal silhouette within normal limits. Lungs hypoinflated. Acute right lower lobe infiltrate, most consistent with pneumonia. Probable small bilateral  pleural effusions. No pulmonary edema. No pneumothorax. No acute osseous abnormality. IMPRESSION: 1. Acute right lower lobe infiltrate, highly suggestive of pneumonia. 2. Probable small bilateral pleural effusions. Electronically Signed    By: Rise MuBenjamin  McClintock M.D.   On: 03/13/2018 06:50   Ct Angio Chest Pe W And/or Wo Contrast  Result Date: 03/13/2018 CLINICAL DATA:  Cough, shortness of breath, and congestion. EXAM: CT ANGIOGRAPHY CHEST WITH CONTRAST TECHNIQUE: Multidetector CT imaging of the chest was performed using the standard protocol during bolus administration of intravenous contrast. Multiplanar CT image reconstructions and MIPs were obtained to evaluate the vascular anatomy. CONTRAST:  75mL ISOVUE-370 IOPAMIDOL (ISOVUE-370) INJECTION 76% COMPARISON:  Chest x-ray from same day. FINDINGS: Cardiovascular: Satisfactory opacification of the pulmonary arteries to the segmental level. No evidence of pulmonary embolism. Normal heart size. No pericardial effusion. Normal caliber thoracic aorta. No aortic dissection. Mediastinum/Nodes: Prominent mediastinal and right hilar lymph nodes measuring up to 10 mm in short axis, likely reactive. No axillary lymphadenopathy. The thyroid gland, trachea, and esophagus demonstrate no significant findings. Lungs/Pleura: Nonenhancing consolidation within the right middle lobe. Small right pleural effusion with adjacent right lower lobe atelectasis. No pleural enhancement. Scattered peripheral centrilobular nodules in the right upper lobe. Subsegmental atelectasis in the lingula and left lower lobe. 6 mm subpleural nodule in the posterior left upper lobe (series 6, image 21) Upper Abdomen: No acute abnormality. Prior splenectomy. 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 Additional adjacent small splenules as well. Musculoskeletal: No chest wall abnormality. No acute or significant osseous findings. Review of the MIP images confirms the above findings. IMPRESSION: 1. Non-enhancing consolidation within the right middle lobe, most consistent with pneumonia given clinical history. Additional scattered peripheral  centrilobular nodules in the right upper lobe are likely infectious or inflammatory. Followup PA and lateral chest X-ray is recommended in 3-4 weeks following trial of antibiotic therapy to ensure resolution and exclude underlying malignancy. 2.  No evidence of pulmonary embolism. 3. Small right pleural effusion with adjacent right lower lobe atelectasis. No pleural enhancement. 4. 6 mm left upper lobe nodule. Non-contrast chest CT at 6-12 months is recommended. If the nodule is stable at time of repeat CT, then future CT at 18-24 months (from today's scan) is considered optional for low-risk patients, but is recommended for high-risk patients. This recommendation follows the consensus statement: Guidelines for Management of Incidental Pulmonary Nodules Detected on CT Images: From the Fleischner Society 2017; Radiology 2017; 284:228-243. Electronically Signed   By: Obie DredgeWilliam T Derry M.D.   On: 03/13/2018 10:19    ASSESSMENT AND PLAN:   Principal Problem:   Community acquired pneumonia Active Problems:   Pneumonia  *Sepsis due to community-acquired pneumonia IV ceftriaxone and azithromycin  As WBCs are rising- will give zosyn to cover for possible aspiration pneumonia.  *Tachycardia Due to sepsis, give IV fluids.  *Dysphagia Due to traumatic brain injury, continue her diet as per preadmission.    All the records are reviewed and case discussed with Care Management/Social Workerr. Management plans discussed with the patient, family and they are in agreement.  CODE STATUS: Full.  TOTAL TIME TAKING CARE OF THIS PATIENT: 35 minutes.     POSSIBLE D/C IN 1-2 DAYS, DEPENDING ON CLINICAL CONDITION.   Altamese DillingVaibhavkumar Necia Kamm M.D on 03/14/2018   Between 7am to 6pm - Pager - [phone]  After 6pm go to [email or web address] - password [email or web address]  [employer] [facility]  Office  [phone]  CC: Primary care physician; Danella PentonMiller, Mark F, MD  Note: This dictation was prepared with Dragon  dictation along with smaller phrase technology. Any transcriptional errors that result from this process are unintentional.

## 2018-03-14 NOTE — Progress Notes (Signed)
Pharmacy Antibiotic Note  Brittany KaufmanSharon S [name] is a 67 y.o. female admitted on 03/13/2018 with IAI.  Pharmacy has been consulted for Zosyn dosing.  Plan: Zosyn 3.375 gm IV Q8H EI  Height: 5\' 4"  (162.6 cm) Weight: 143 lb 4.8 oz (65 kg) IBW/kg (Calculated) : 54.7  Temp (24hrs), Avg:98.1 F (36.7 C), Min:97.8 F (36.6 C), Max:98.7 F (37.1 C)  Recent Labs  Lab 03/13/18 0616 03/13/18 1107 03/14/18 0531  WBC 16.6* 21.1* 31.1*  CREATININE 0.80 0.74 0.69  LATICACIDVEN  --  1.4  --     Estimated Creatinine Clearance: 61.3 mL/min (by C-G formula based on SCr of 0.69 mg/dL).    Allergies  Allergen Reactions  . Sulfa Antibiotics Nausea Only and Rash    Antimicrobials this admission:  Dose adjustments this admission:  Microbiology results:  BCx:   UCx:    Sputum:    MRSA PCR:   Thank you for allowing pharmacy to be a part of this patient's care.  Carola FrostNathan A Chaynce Schafer, Pharm.D., BCPS Clinical Pharmacist 03/14/2018 8:35 AM

## 2018-03-15 ENCOUNTER — Inpatient Hospital Stay: Payer: Medicare Other

## 2018-03-15 DIAGNOSIS — A419 Sepsis, unspecified organism: Principal | ICD-10-CM

## 2018-03-15 DIAGNOSIS — J189 Pneumonia, unspecified organism: Secondary | ICD-10-CM

## 2018-03-15 DIAGNOSIS — J90 Pleural effusion, not elsewhere classified: Secondary | ICD-10-CM

## 2018-03-15 LAB — BASIC METABOLIC PANEL
ANION GAP: 6 (ref 5–15)
BUN: 11 mg/dL (ref 8–23)
CALCIUM: 8.8 mg/dL — AB (ref 8.9–10.3)
CO2: 27 mmol/L (ref 22–32)
CREATININE: 0.62 mg/dL (ref 0.44–1.00)
Chloride: 106 mmol/L (ref 98–111)
GFR calc Af Amer: >60 mL/min (ref >60)
GFR calc non Af Amer: >60 mL/min (ref >60)
GLUCOSE: 113 mg/dL — AB (ref 70–99)
Potassium: 3.7 mmol/L (ref 3.5–5.1)
Sodium: 139 mmol/L (ref 135–145)

## 2018-03-15 LAB — CBC
HEMATOCRIT: 40 % (ref 35.0–47.0)
Hemoglobin: 13.5 g/dL (ref 12.0–16.0)
MCH: 32.2 pg (ref 26.0–34.0)
MCHC: 33.9 g/dL (ref 32.0–36.0)
MCV: 95.2 fL (ref 80.0–100.0)
Platelets: 482 10*3/uL — ABNORMAL HIGH (ref 150–440)
RBC: 4.2 MIL/uL (ref 3.80–5.20)
RDW: 13.1 % (ref 11.5–14.5)
WBC: 36.1 10*3/uL — ABNORMAL HIGH (ref 3.6–11.0)

## 2018-03-15 LAB — PROCALCITONIN: PROCALCITONIN: 0.44 ng/mL

## 2018-03-15 LAB — HIV ANTIBODY (ROUTINE TESTING W REFLEX): HIV Screen 4th Generation wRfx: NONREACTIVE

## 2018-03-15 MED ORDER — IOHEXOL 300 MG/ML  SOLN
75.0000 mL | Freq: Once | INTRAMUSCULAR | Status: AC | PRN
  Administered 2018-03-15: 75 mL via INTRAVENOUS

## 2018-03-15 MED ORDER — VANCOMYCIN HCL IN DEXTROSE 1-5 GM/200ML-% IV SOLN
1000.0000 mg | INTRAVENOUS | Status: DC
  Administered 2018-03-16 – 2018-03-17 (×3): 1000 mg via INTRAVENOUS
  Filled 2018-03-15 (×4): qty 200

## 2018-03-15 MED ORDER — VANCOMYCIN HCL IN DEXTROSE 1-5 GM/200ML-% IV SOLN
1000.0000 mg | Freq: Once | INTRAVENOUS | Status: AC
  Administered 2018-03-15: 1000 mg via INTRAVENOUS
  Filled 2018-03-15: qty 200

## 2018-03-15 MED ORDER — DEXTROSE 5 % IV SOLN
250.0000 mg | INTRAVENOUS | Status: DC
  Administered 2018-03-15 – 2018-03-17 (×2): 250 mg via INTRAVENOUS
  Filled 2018-03-15 (×4): qty 250

## 2018-03-15 MED ORDER — DIAZEPAM 5 MG PO TABS
5.0000 mg | ORAL_TABLET | Freq: Every day | ORAL | Status: DC | PRN
  Administered 2018-03-15: 5 mg via ORAL
  Filled 2018-03-15: qty 1

## 2018-03-15 MED ORDER — DIAZEPAM 5 MG PO TABS
5.0000 mg | ORAL_TABLET | Freq: Every day | ORAL | Status: DC
  Administered 2018-03-16 – 2018-03-22 (×7): 5 mg via ORAL
  Filled 2018-03-15 (×7): qty 1

## 2018-03-15 NOTE — Consult Note (Signed)
Pulmonary Critical Care  Initial Consult Note  Brittany KaufmanSharon S [name] ZOX:[id number] DOB: 1951/01/28 DOA: 03/13/2018  Referring physician: Madelon LipsVacchani  Chief Complaint: pneumonia  HPI: Brittany [name] is a 67 y.o. female presented to the hospital with complaints of abdominal pain.  Apparently patient lives in a group home has a history of traumatic brain injury speech impediment.  Has been on a pured diet with nectar thick liquids.  Patient has had a history of pneumonia in the right side in the past.  There is high concern for aspiration.  She has been diagnosed with pneumonia as an outpatient and was being treated with oral Levaquin had worsening of her symptoms.  On presentation patient had a chest x-ray done which showed a right lower lobe infiltrate consistent with pneumonitis.  Chest CT was also done to rule out pulmonary embolism which was unremarkable for PE however showed no pneumonia.  Patient had a follow-up chest x-ray done apparently today and it shows worsening of the right lower lobe infiltrate with loss of the cardiac border.  I am asked to see the patient for further evaluation.  I reviewed the chest from the chest film does show some worsening my concern here would be presence of underlying worsening pleural effusion which could go into an empyema.  She is currently on Zosyn but no atypical coverage although she had been on Levaquin as an outpatient.  Her blood cultures are negative to date MRSA screen is negative.  Review of Systems:  Attempted complete ROS and is unremarkable other than noted in HPI patient is not able to participate  Past Medical History:  Diagnosis Date  . Speech impairment   . TBI (traumatic brain injury) [facility](HCC)    Past Surgical History:  Procedure Laterality Date  . BREAST EXCISIONAL BIOPSY Left 1992   neg  . SPLENECTOMY, TOTAL     Social History:  reports that she has quit smoking. She has never used smokeless tobacco. Her alcohol and drug histories are not on  file.  Allergies  Allergen Reactions  . Sulfa Antibiotics Nausea Only and Rash    Family History  Problem Relation Age of Onset  . Hypertension Mother   . Breast cancer Neg Hx     Prior to Admission medications   Medication Sig Start Date End Date Taking? Authorizing Provider  diazepam (VALIUM) 10 MG tablet Take 10 mg by mouth at bedtime as needed.   Yes [provider]  levofloxacin (LEVAQUIN) 500 MG tablet Take 500 mg by mouth daily. 03/10/18 03/20/18 Yes [provider]  pseudoephedrine (SUDAFED) 30 MG tablet Take 60 mg by mouth 2 (two) times daily as needed for congestion.   Yes [provider]   Physical Exam: Vitals:   03/14/18 2011 03/14/18 2243 03/15/18 0556 03/15/18 1156  BP: 134/66  115/61 124/65  Pulse: (!) 140 (!) 122 (!) 115 (!) 109  Resp: 20  20 16   Temp: (!) 100.7 F (38.2 C) 98.6 F (37 C) 98.6 F (37 C) 98.2 F (36.8 C)  TempSrc: Oral Oral Oral Oral  SpO2: 96% 90% 91% 91%  Weight:      Height:        Wt Readings from Last 3 Encounters:  03/13/18 65 kg  12/07/14 65.3 kg    General:  Appears calm and comfortable Eyes: PERRL, normal lids, irises & conjunctiva ENT: grossly normal hearing, lips & tongue Neck: no LAD, masses or thyromegaly Cardiovascular: RRR, no m/r/g. No LE edema. Respiratory: CTA left  side diminished on the right base       Normal respiratory effort. Abdomen: soft, nontender Skin: no rash or induration seen on limited exam Musculoskeletal: grossly normal tone BUE/BLE Psychiatric: grossly normal mood and affect Neurologic: grossly non-focal.          Labs on Admission:  Basic Metabolic Panel: Recent Labs  Lab 03/13/18 0616 03/13/18 1107 03/14/18 0531 03/15/18 0447  NA 139  --  139 139  K 4.3  --  3.9 3.7  CL 103  --  107 106  CO2 27  --  25 27  GLUCOSE 105*  --  125* 113*  BUN 10  --  11 11  CREATININE 0.80 0.74 0.69 0.62  CALCIUM 9.3  --  8.7* 8.8*   Liver Function Tests: Recent Labs   Lab 03/13/18 0616  AST 37  ALT 31  ALKPHOS 96  BILITOT 0.5  PROT 7.6  ALBUMIN 3.4*   Recent Labs  Lab 03/13/18 0616  LIPASE 30   No results for input(s): AMMONIA in the last 168 hours. CBC: Recent Labs  Lab 03/13/18 0616 03/13/18 1107 03/14/18 0531 03/15/18 0447  WBC 16.6* 21.1* 31.1* 36.1*  HGB 14.4 13.6 13.1 13.5  HCT 42.7 40.0 37.2 40.0  MCV 94.4 93.7 93.9 95.2  PLT 463* 494* 483* 482*   Cardiac Enzymes: Recent Labs  Lab 03/13/18 0616  TROPONINI <0.03    BNP (last 3 results) No results for input(s): BNP in the last 8760 hours.  ProBNP (last 3 results) No results for input(s): PROBNP in the last 8760 hours.  CBG: No results for input(s): GLUCAP in the last 168 hours.  Radiological Exams on Admission: Dg Chest 2 View  Result Date: 03/15/2018 CLINICAL DATA:  History of pneumonia. EXAM: CHEST - 2 VIEW COMPARISON:  Chest CT 03/13/2018; chest radiograph 03/13/2018 FINDINGS: Stable cardiac and mediastinal contours. Significant interval increase in consolidation throughout the right lung. Small right pleural effusion. No pneumothorax. Thoracic spine degenerative changes. IMPRESSION: Worsening right lung consolidation compatible with pneumonia. Electronically Signed   By: Annia Beltrew  Davis M.D.   On: 03/15/2018 07:30    EKG: Independently reviewed.  Assessment/Plan Principal Problem:   Community acquired pneumonia Active Problems:   Pneumonia   1. Lobar pneumonia currently on the chest x-ray does appear to be worsening however my concern would also be if there is an underlying pleural effusion which has worsened.  The last chest film had shown a small pleural effusion.  If this is not worsening of effusion it could represent empyema therefore I think a follow-up CT scan would be of some help.  In addition I would recommend broadening the antibiotics right now is on Zosyn would consider adding atypical coverage in the form of azithromycin and also consider adding  vancomycin.  Patient likely has aspiration issues based on the history reviewed. 2. Sepsis hemodynamically stable we will continue with supportive care pressures closely 3. Pleural effusion as reviewed above my concern is for empyema which could develop in this situation and would require drainage.  Code Status: Full code  Family Communication: None Disposition Plan: Group home  Time spent: 70 minutes  I have personally obtained a history, examined the patient, evaluated laboratory and imaging results, formulated the assessment and plan and placed orders.  The Patient requires high complexity decision making for assessment and support. Total Time Spent 70min   Yevonne PaxSaadat A Kallee Nam, MD [facility] Pulmonary Critical Care Medicine Sleep Medicine

## 2018-03-15 NOTE — Progress Notes (Signed)
[employer] [facility] [place] at [facility]   PATIENT NAME: Brittany [name]    MR#:  [id number]  DATE OF BIRTH:  [birth year]  SUBJECTIVE:  CHIEF COMPLAINT:   Chief Complaint  Patient presents with  . Abdominal Pain   Came with pneumonia, have some complains of pain in chest and abdomen.  WBCs are worsening and her xray is worse than before.  REVIEW OF SYSTEMS:  CONSTITUTIONAL: No fever, fatigue or weakness.  EYES: No blurred or double vision.  EARS, NOSE, AND THROAT: No tinnitus or ear pain.  RESPIRATORY: have cough, shortness of breath,no wheezing or hemoptysis.  CARDIOVASCULAR: have chest pain, orthopnea, edema.  GASTROINTESTINAL: No nausea, vomiting, diarrhea or abdominal pain.  GENITOURINARY: No dysuria, hematuria.  ENDOCRINE: No polyuria, nocturia,  HEMATOLOGY: No anemia, easy bruising or bleeding SKIN: No rash or lesion. MUSCULOSKELETAL: No joint pain or arthritis.   NEUROLOGIC: No tingling, numbness, weakness.  PSYCHIATRY: No anxiety or depression.   ROS  DRUG ALLERGIES:   Allergies  Allergen Reactions  . Sulfa Antibiotics Nausea Only and Rash    VITALS:  Blood pressure 135/62, pulse (!) 130, temperature 98.2 F (36.8 C), temperature source Oral, resp. rate 16, height 5\' 4"  (1.626 m), weight 65 kg, SpO2 91 %.  PHYSICAL EXAMINATION:   GENERAL:  67 y.o.-year-old patient lying in the bed with no acute distress.  EYES: Pupils equal, round, reactive to light and accommodation. No scleral icterus. Extraocular muscles intact.  HEENT: Head atraumatic, normocephalic. Oropharynx and nasopharynx clear.  Speech is altered NECK:  Supple, no jugular venous distention. No thyroid enlargement, no tenderness.  LUNGS: Decreased breath sound on right side, no wheezing, some crepitation. No use of accessory muscles of respiration.  CARDIOVASCULAR: S1, S2 normal. No murmurs, rubs, or gallops.  ABDOMEN: Soft, nontender, nondistended. Bowel sounds present. No organomegaly or  mass.  EXTREMITIES: No pedal edema, cyanosis, or clubbing.  NEUROLOGIC: Cranial nerves II through XII are intact. Muscle strength 5/5 in all extremities. Sensation intact. Gait not checked.  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 PSYCHIATRIC: The patient is alert and oriented x 3.  SKIN: No obvious rash, lesion, or ulcer.   Physical Exam LABORATORY PANEL:   CBC Recent Labs  Lab 03/15/18 0447  WBC 36.1*  HGB 13.5  HCT 40.0  PLT 482*   ------------------------------------------------------------------------------------------------------------------  Chemistries  Recent Labs  Lab 03/13/18 0616  03/15/18 0447  NA 139   < > 139  K 4.3   < > 3.7  CL 103   < > 106  CO2 27   < > 27  GLUCOSE 105*   < > 113*  BUN 10   < > 11  CREATININE 0.80   < > 0.62  CALCIUM 9.3   < > 8.8*  AST 37  --   --   ALT 31  --   --   ALKPHOS 96  --   --   BILITOT 0.5  --   --    < > = values in this interval not displayed.   ------------------------------------------------------------------------------------------------------------------  Cardiac Enzymes Recent Labs  Lab 03/13/18 0616  TROPONINI <0.03   ------------------------------------------------------------------------------------------------------------------  RADIOLOGY:  Dg Chest 2 View  Result Date: 03/15/2018 CLINICAL DATA:  History of pneumonia. EXAM: CHEST - 2 VIEW COMPARISON:  Chest CT 03/13/2018; chest radiograph 03/13/2018 FINDINGS: Stable cardiac and mediastinal contours. Significant interval increase in consolidation  throughout the right lung. Small right pleural effusion. No pneumothorax. Thoracic spine degenerative changes. IMPRESSION: Worsening right lung consolidation compatible with pneumonia. Electronically Signed   By: Annia Beltrew  Davis M.D.   On: 03/15/2018 07:30   Ct Chest W Contrast  Result Date: 03/15/2018 CLINICAL DATA:   Follow-up pleural effusion, pneumonia EXAM: CT CHEST WITH CONTRAST TECHNIQUE: Multidetector CT imaging of the chest was performed during intravenous contrast administration. CONTRAST:  75mL OMNIPAQUE IOHEXOL 300 MG/ML  SOLN COMPARISON:  CT 03/13/2018.  Chest x-ray 03/15/2018 FINDINGS: Cardiovascular: Heart is normal size.  Aorta is normal caliber. Mediastinum/Nodes: Enlarging mediastinal lymph nodes. Right paratracheal lymph node has a short axis diameter of 14 mm. Low right paratracheal lymph node has a short axis diameter of 12 mm. Subcarinal lymph node has a short axis diameter of 17 mm. These are slightly more prominent than on prior CT. Lungs/Pleura: Enlarging loculated right pleural effusion, now large in size with loculations noted posteriorly and laterally. Small left pleural effusion. Consolidation in the right middle lobe and right lower lobe similar to prior study compatible with pneumonia. Compressive atelectasis in the left lower lobe. Posterior left upper lobe nodule again noted, unchanged. Upper Abdomen: Imaging into the upper abdomen shows no acute findings. Musculoskeletal: Chest wall soft tissues are unremarkable. No acute bony abnormality. IMPRESSION: Enlarging large left pleural effusion which is loculated posteriorly and laterally. Continued consolidation in the right lower lobe and right middle lobe compatible with pneumonia, not significantly changed. Small left effusion with left base atelectasis. Stable left upper lobe nodule. Mediastinal adenopathy, presumably reactive. This could be followed on subsequent imaging. Electronically Signed   By: Charlett NoseKevin  Dover M.D.   On: 03/15/2018 13:25   [place] Chest (pleural Effusion)  Result Date: 03/15/2018 CLINICAL DATA:  Increasing right-sided pleural effusion EXAM: CHEST ULTRASOUND COMPARISON:  CT from earlier in the same day. FINDINGS: There is an enlarging right-sided pleural effusion. Initial scanning was performed as a prelude to possible  thoracentesis although the fluid is shown to be significantly multiloculated and not likely to respond adequately to percutaneous drainage. Large bore chest tube drainage is likely necessary. IMPRESSION: Significant loculated increased pleural effusion on the right similar to that seen on recent chest CT. Small bore catheter drainage is not likely to be significantly helpful for the patient. These findings were communicated to Dr. Elisabeth PigeonVachhani at the time of exam performance. Electronically Signed   By: Alcide CleverMark  Lukens M.D.   On: 03/15/2018 15:58    ASSESSMENT AND PLAN:   Principal Problem:   Community acquired pneumonia Active Problems:   Pneumonia  *Sepsis due to community-acquired pneumonia IV ceftriaxone and azithromycin  As WBCs are rising- Changed to zosyn to cover for possible aspiration pneumonia.  Added vancomycin  * Pleural effusion   Due to worsening pneumonia, ordered and reviewed CT chest- large right sided pleural effusion. Spoke to pt, her legal POA- Mark and her mother, also discussed with Pulm Dr. Welton FlakesKhan and Radiologist for need of Pleural tap. Later notified about the collection being loculated, and possible need of chest tube- called Consult with thorasic surgery.  *Tachycardia Due to sepsis, give IV fluids.  *Dysphagia Due to traumatic brain injury, continue her diet as per preadmission.    All the records are reviewed and case discussed with Care Management/Social Workerr. Management plans discussed with the patient, family and they are in agreement.  CODE STATUS: Full.  TOTAL TIME TAKING CARE OF THIS PATIENT: 55 minutes.     POSSIBLE D/C IN 1-2  DAYS, DEPENDING ON CLINICAL CONDITION.   Altamese DillingVaibhavkumar Adana Marik M.D on 03/15/2018   Between 7am to 6pm - Pager - [phone]  After 6pm go to [email or web address] - password [email or web address]  [facility] [place] [facility]  Office  [phone]  CC: Primary care physician; Danella PentonMiller, Mark F, MD  Note: This dictation was prepared  with Dragon dictation along with smaller phrase technology. Any transcriptional errors that result from this process are unintentional.

## 2018-03-15 NOTE — Progress Notes (Signed)
Pharmacy Antibiotic Note  Brittany [name] is a 67 y.o. female admitted on 03/13/2018 with IAI.  Pharmacy has been consulted for Zosyn dosing. 03/15/18 due to worsening effusion and continued consolidation in RLL and RML (unchanged pneumonia) pharmacy has also been consulted to dose vancomycin.  Plan: Continue Zosyn 3.375 gm IV Q8H EI  Start vancomycin 1 gm IV x 1 followed in approximately 11 hours (stacked dosing) by vancomycin 1 gm IV Q18H, predicted trough 16 mcg/ml. Pharmacy will continue to follow and adjust as needed to maintain trough 15 to 20 mcg/ml.   Vd 38 L, Ke 0.055 hr-1, T1/2 12.6 hr  Height: 5\' 4"  (162.6 cm) Weight: 143 lb 4.8 oz (65 kg) IBW/kg (Calculated) : 54.7  Temp (24hrs), Avg:99 F (37.2 C), Min:98.2 F (36.8 C), Max:100.7 F (38.2 C)  Recent Labs  Lab 03/13/18 0616 03/13/18 1107 03/14/18 0531 03/15/18 0447  WBC 16.6* 21.1* 31.1* 36.1*  CREATININE 0.80 0.74 0.69 0.62  LATICACIDVEN  --  1.4  --   --     Estimated Creatinine Clearance: 61.3 mL/min (by C-G formula based on SCr of 0.62 mg/dL).    Allergies  Allergen Reactions  . Sulfa Antibiotics Nausea Only and Rash    Antimicrobials this admission: 8/18 Received vanc/cefepime x 1 in ED 8/18 ceftriaxone and azithromycin were given on admission 8/19 abx changed to Zosyn due to rising WBC - cover for aspiration 8/20 addvancomycin due to worsening CT chest with progressing effusions and continued PNA  Dose adjustments this admission:  Microbiology results: 8/18 BCx: NGTD  UCx:    Sputum:   8/18 MRSA PCR: negative  Thank you for allowing pharmacy to be a part of this patient's care.  Carola FrostNathan A Linnaea Ahn, Pharm.D., BCPS Clinical Pharmacist 03/15/2018 2:15 PM

## 2018-03-16 ENCOUNTER — Inpatient Hospital Stay: Payer: Self-pay

## 2018-03-16 ENCOUNTER — Inpatient Hospital Stay: Payer: Medicare Other

## 2018-03-16 DIAGNOSIS — J90 Pleural effusion, not elsewhere classified: Secondary | ICD-10-CM

## 2018-03-16 LAB — GLUCOSE, PLEURAL OR PERITONEAL FLUID: Glucose, Fluid: 53 mg/dL

## 2018-03-16 LAB — CBC
HEMATOCRIT: 37.3 % (ref 35.0–47.0)
HEMOGLOBIN: 12.8 g/dL (ref 12.0–16.0)
MCH: 32 pg (ref 26.0–34.0)
MCHC: 34.4 g/dL (ref 32.0–36.0)
MCV: 92.9 fL (ref 80.0–100.0)
Platelets: 487 10*3/uL — ABNORMAL HIGH (ref 150–440)
RBC: 4.02 MIL/uL (ref 3.80–5.20)
RDW: 13.2 % (ref 11.5–14.5)
WBC: 31.7 10*3/uL — AB (ref 3.6–11.0)

## 2018-03-16 LAB — PROCALCITONIN: Procalcitonin: 0.56 ng/mL

## 2018-03-16 LAB — BODY FLUID CELL COUNT WITH DIFFERENTIAL
Eos, Fluid: 1 %
LYMPHS FL: 3 %
Monocyte-Macrophage-Serous Fluid: 14 %
NEUTROPHIL FLUID: 82 %
WBC FLUID: 1185 uL

## 2018-03-16 LAB — LACTATE DEHYDROGENASE, PLEURAL OR PERITONEAL FLUID: LD FL: 1174 U/L — AB (ref 3–23)

## 2018-03-16 LAB — PROTEIN, PLEURAL OR PERITONEAL FLUID: Total protein, fluid: 4.5 g/dL

## 2018-03-16 MED ORDER — FENTANYL CITRATE (PF) 100 MCG/2ML IJ SOLN
INTRAMUSCULAR | Status: AC | PRN
  Administered 2018-03-16: 25 ug via INTRAVENOUS
  Administered 2018-03-16: 50 ug via INTRAVENOUS

## 2018-03-16 MED ORDER — FENTANYL CITRATE (PF) 100 MCG/2ML IJ SOLN
INTRAMUSCULAR | Status: AC
  Filled 2018-03-16: qty 4

## 2018-03-16 MED ORDER — SODIUM CHLORIDE 0.9 % IV SOLN
INTRAVENOUS | Status: DC
  Administered 2018-03-16: 13:00:00 via INTRAVENOUS

## 2018-03-16 MED ORDER — SODIUM CHLORIDE 0.9% FLUSH: 10.0000 mL | INTRAVENOUS | Status: DC | PRN

## 2018-03-16 MED ORDER — SODIUM CHLORIDE 0.9% FLUSH
10.0000 mL | Freq: Two times a day (BID) | INTRAVENOUS | Status: DC
  Administered 2018-03-16: 20 mL
  Administered 2018-03-17: 10 mL
  Administered 2018-03-17 – 2018-03-18 (×3): 20 mL
  Administered 2018-03-19 – 2018-03-22 (×8): 10 mL

## 2018-03-16 MED ORDER — MIDAZOLAM HCL 5 MG/5ML IJ SOLN
INTRAMUSCULAR | Status: AC
  Filled 2018-03-16: qty 5

## 2018-03-16 MED ORDER — MIDAZOLAM HCL 5 MG/5ML IJ SOLN
INTRAMUSCULAR | Status: AC | PRN
  Administered 2018-03-16 (×2): 0.5 mg via INTRAVENOUS
  Administered 2018-03-16: 1 mg via INTRAVENOUS

## 2018-03-16 NOTE — Care Management Important Message (Signed)
Initial signed by POA in room.  Blank copy left for their records.

## 2018-03-16 NOTE — Progress Notes (Signed)
Per MD okay for RN to place diet order.  

## 2018-03-16 NOTE — Consult Note (Addendum)
Date of Admission:  03/13/2018                 Reason for Consult: Worsening pneumonia   Referring Provider: Elisabeth PigeonVachhani No history from patient- chart reviewed  HPI: Brittany [name] is a 67 y.o. female with a history of traumatic brain injury,speech impediment, splenectomy  wheel chair bound in  A group home was admitted to the hospital with worsening pneumonia even after starting on levaquin by PCP. PT continued to have worsening cough and rt sided chest pain. On admission to the ED she was found to have rt lung infiltrate with effusion and WBC was 16.6. She ws started on ceftriaxone and zithromax. As she started to get worse with increasing WBC, fever a CT chestw as repeated on 8/20 and it showed worsening rt pleural effusion and thoracic surgeon saw the patient and recommend IR drain the effusion which was done on 8/21. Also her antibiotics were escalated to vanco and zosyn. Pt is feeling better now,. Has pain rt side of chest at the site of drain   Past Medical History:  Diagnosis Date  . Speech impairment   . TBI (traumatic brain injury) [facility](HCC)     Past Surgical History:  Procedure Laterality Date  . BREAST EXCISIONAL BIOPSY Left 1992   neg  . SPLENECTOMY, TOTAL      Social History   Tobacco Use  . Smoking status: Former Games developermoker  . Smokeless tobacco: Never Used  Substance Use Topics  . Alcohol use: Not on file  . Drug use: Not on file    Family History  Problem Relation Age of Onset  . Hypertension Mother   . Breast cancer Neg Hx      . diazepam  5 mg Oral QHS      Abtx:  Anti-infectives (From admission, onward)   Start     Dose/Rate Route Frequency Ordered Stop   03/16/18 0200  vancomycin (VANCOCIN) IVPB 1000 mg/200 mL premix     1,000 mg 200 mL/hr over 60 Minutes Intravenous Every 18 hours 03/15/18 1414     03/15/18 1800  azithromycin (ZITHROMAX) 250 mg in dextrose 5 % 125 mL IVPB     250 mg 125 mL/hr over 60 Minutes Intravenous Every 24 hours 03/15/18 1659      03/15/18 1415  vancomycin (VANCOCIN) IVPB 1000 mg/200 mL premix     1,000 mg 200 mL/hr over 60 Minutes Intravenous  Once 03/15/18 1414 03/15/18 1934   03/14/18 0900  piperacillin-tazobactam (ZOSYN) IVPB 3.375 g     3.375 g 12.5 mL/hr over 240 Minutes Intravenous Every 8 hours 03/14/18 0835     03/13/18 1100  azithromycin (ZITHROMAX) 250 mg in dextrose 5 % 125 mL IVPB  Status:  Discontinued     250 mg 125 mL/hr over 60 Minutes Intravenous Every 24 hours 03/13/18 0853 03/14/18 0811   03/13/18 1000  cefTRIAXone (ROCEPHIN) 1 g in sodium chloride 0.9 % 100 mL IVPB  Status:  Discontinued     1 g 200 mL/hr over 30 Minutes Intravenous Every 24 hours 03/13/18 0853 03/14/18 0811   03/13/18 0745  vancomycin (VANCOCIN) IVPB 1000 mg/200 mL premix     1,000 mg 200 mL/hr over 60 Minutes Intravenous  Once 03/13/18 0744 03/13/18 0934   03/13/18 0745  ceFEPIme (MAXIPIME) 2 g in sodium chloride 0.9 % 100 mL IVPB     2 g 200 mL/hr over 30 Minutes Intravenous  Once 03/13/18 0744 03/13/18 0904  Review of Systems: Not available  Allergies  Allergen Reactions  . Sulfa Antibiotics Nausea Only and Rash    OBJECTIVE: Blood pressure 117/70, pulse (!) 109, temperature 98.8 F (37.1 C), temperature source Oral, resp. rate 20, height 5\' 4"  (1.626 m), weight 65 kg, SpO2 91 %.  Physical Exam Constitutional- no distress. Awake, alert , speech not comprehensible Eyes-PERL HEAD- scar from previous surgery ENT no maxillary tenderness Tongue coated-  CVS-s1s2 RS b/l air entry- decreased rt side Rt chest drain GI- lap scar No tenderness MSK- wasting of arms and legs  SKIN- no rash Neurologic- dystonia of tongue and arms Psychiatric-  Lymphatic  Lab Results CBC    Component Value Date/Time   WBC 31.7 (H) 03/16/2018 0941   RBC 4.02 03/16/2018 0941   HGB 12.8 03/16/2018 0941   HGB 14.5 01/10/2014 0551   HCT 37.3 03/16/2018 0941   HCT 41.9 01/10/2014 0551   PLT 487 (H) 03/16/2018 0941    PLT 268 01/10/2014 0551   MCV 92.9 03/16/2018 0941   MCV 98 01/10/2014 0551   MCH 32.0 03/16/2018 0941   MCHC 34.4 03/16/2018 0941   RDW 13.2 03/16/2018 0941   RDW 13.3 01/10/2014 0551   LYMPHSABS 3.4 01/10/2014 0551   MONOABS 0.9 01/10/2014 0551   EOSABS 0.2 01/10/2014 0551   BASOSABS 0.1 01/10/2014 0551    CMP Latest Ref Rng & Units 03/15/2018 03/14/2018 03/13/2018  Glucose 70 - 99 mg/dL 098(J113(H) 191(Y125(H) -  BUN 8 - 23 mg/dL 11 11 -  Creatinine 7.820.44 - 1.00 mg/dL 9.560.62 2.130.69 0.860.74  Sodium 135 - 145 mmol/L 139 139 -  Potassium 3.5 - 5.1 mmol/L 3.7 3.9 -  Chloride 98 - 111 mmol/L 106 107 -  CO2 22 - 32 mmol/L 27 25 -  Calcium 8.9 - 10.3 mg/dL 5.7(Q8.8(L) 4.6(N8.7(L) -  Total Protein 6.5 - 8.1 g/dL - - -  Total Bilirubin 0.3 - 1.2 mg/dL - - -  Alkaline Phos 38 - 126 U/L - - -  AST 15 - 41 U/L - - -  ALT 0 - 44 U/L - - -      Microbiology: Recent Results (from the past 240 hour(s))  Blood Culture (routine x 2)     Status: None (Preliminary result)   Collection Time: 03/13/18  8:11 AM  Result Value Ref Range Status   Specimen Description BLOOD LEFT ANTECUBITAL  Final   Special Requests   Final    BOTTLES DRAWN AEROBIC AND ANAEROBIC Blood Culture adequate volume   Culture   Final    NO GROWTH 3 DAYS Performed at [facility], [address] [place], [place] [address]    Report Status PENDING  Incomplete  Blood Culture (routine x 2)     Status: None (Preliminary result)   Collection Time: 03/13/18  8:11 AM  Result Value Ref Range Status   Specimen Description BLOOD BLOOD LEFT WRIST  Final   Special Requests   Final    BOTTLES DRAWN AEROBIC AND ANAEROBIC Blood Culture adequate volume   Culture   Final    NO GROWTH 3 DAYS Performed at [facility], [address] [place], [place] [address]    Report Status PENDING  Incomplete  MRSA PCR Screening     Status: None   Collection Time: 03/13/18  8:53 AM  Result Value Ref Range Status   MRSA by PCR NEGATIVE NEGATIVE  Final    Comment:        The  GeneXpert MRSA Assay (FDA approved for NASAL specimens only), is one component of a comprehensive MRSA colonization surveillance program. It is not intended to diagnose MRSA infection nor to guide or monitor treatment for MRSA infections. Performed at [facility], [address], [place], [place] [address]     Radiographs and labs were personally reviewed by me. Rt loculated pleural effusion Rt pneumonia    Assessment and Plan 67 y.o. female with a history of traumatic brain injury,speech impediment, splenectomy  wheel chair bound in  A group home was admitted to the hospital with worsening pneumonia even after starting on levaquin by PCP. PT continued to have worsening cough and rt sided chest pain.  Rt lung pneumonia with parapneumonic effusion - from group home- common organisms pneumococcus, sta[h and gram neg.with splenectomy capsulated organisms are a concern- adequately covered  On vanco, zosyn and zithro continue the same until fluid culture results MRSA nares neg- Has rt chest drain  TBI   Discussed the management with her care taker at bed side and with Dr.Vachhani  Lynn Ito, MD  03/16/2018, 12:24 PM

## 2018-03-16 NOTE — Progress Notes (Signed)
Patient ID: Brittany [name], female   DOB: October 28, 1950, 67 y.o.   MRN: [id number]  Chief Complaint  Patient presents with  . Abdominal Pain    Referred By Dr. Elisabeth PigeonVachhani Reason for Referral Right sided pleural effusion  HPI Location, Quality, Duration, Severity, Timing, Context, Modifying Factors, Associated Signs and Symptoms.  Brittany [name] is a 67 y.o. female.  Her problems began several days ago when she presented to an urgent care [facility] complaining of some right-sided chest discomfort.  A chest x-ray was obtained which revealed a probable right lower lobe pneumonia and she was begun on oral antibiotic therapy.  Her pain persisted and she was ultimately admitted to the hospital where a repeat chest x-ray and CT scan showed a worsening pneumonia on the right.  A subsequent ultrasound and another CT scan demonstrated that the pneumonia have progressed and she now had a fairly large sized pleural effusion associated with that.  She is remained relatively afebrile and does not complain of any significant cough at this point.  She does have a past medical history significant for a closed head injury with some resultant dysfunction including speech impairment and lower extremity mobility.  I was asked to see the patient for management of her pleural effusion.  I reviewed her films with Dr. Irish LackGlenn Yamagata and interventional radiology and he felt that this would be amenable to some type of percutaneous approach.  I relayed that to the patient and her caregivers and they have agreed with that.   Past Medical History:  Diagnosis Date  . Speech impairment   . TBI (traumatic brain injury) [facility](HCC)     Past Surgical History:  Procedure Laterality Date  . BREAST EXCISIONAL BIOPSY Left 1992   neg  . SPLENECTOMY, TOTAL      Family History  Problem Relation Age of Onset  . Hypertension Mother   . Breast cancer Neg Hx     Social History Social History   Tobacco Use  .  Smoking status: Former Games developermoker  . Smokeless tobacco: Never Used  Substance Use Topics  . Alcohol use: Not on file  . Drug use: Not on file    Allergies  Allergen Reactions  . Sulfa Antibiotics Nausea Only and Rash    Current Facility-Administered Medications  Medication Dose Route Frequency Provider Last Rate Last Dose  . 0.9 %  sodium chloride infusion   Intravenous Continuous Irish LackYamagata, Glenn, MD      . acetaminophen (TYLENOL) tablet 650 mg  650 mg Oral Q6H PRN Shaune Pollackhen, Qing, MD   650 mg at 03/14/18 2047  . azithromycin (ZITHROMAX) 250 mg in dextrose 5 % 125 mL IVPB  250 mg Intravenous Q24H Altamese DillingVachhani, Vaibhavkumar, MD   Stopped at 03/15/18 1952  . diazepam (VALIUM) tablet 5 mg  5 mg Oral QHS Altamese DillingVachhani, Vaibhavkumar, MD      . diazepam (VALIUM) tablet 5 mg  5 mg Oral Daily PRN Altamese DillingVachhani, Vaibhavkumar, MD   5 mg at 03/15/18 1844  . docusate sodium (COLACE) capsule 100 mg  100 mg Oral BID PRN Altamese DillingVachhani, Vaibhavkumar, MD      . guaiFENesin (ROBITUSSIN) 100 MG/5ML solution 200 mg  10 mL Oral Q6H PRN Altamese DillingVachhani, Vaibhavkumar, MD   200 mg at 03/16/18 0853  . heparin injection 5,000 Units  5,000 Units Subcutaneous Q8H Altamese DillingVachhani, Vaibhavkumar, MD   5,000 Units at 03/16/18 0517  . oxyCODONE-acetaminophen (PERCOCET/ROXICET) 5-325 MG per tablet 1 tablet  1 tablet Oral Q6H PRN  Altamese DillingVachhani, Vaibhavkumar, MD   1 tablet at 03/15/18 [phone]  . piperacillin-tazobactam (ZOSYN) IVPB 3.375 g  3.375 g Intravenous Q8H Altamese DillingVachhani, Vaibhavkumar, MD 12.5 mL/hr at 03/16/18 0852 3.375 g at 03/16/18 0852  . vancomycin (VANCOCIN) IVPB 1000 mg/200 mL premix  1,000 mg Intravenous Q18H Altamese DillingVachhani, Vaibhavkumar, MD   Stopped at 03/16/18 0230      Review of Systems A complete review of systems was asked and was negative except for the following positive findings right-sided chest pain.  Blood pressure 117/70, pulse (!) 109, temperature 98.8 F (37.1 C), temperature source Oral, resp. rate 20, height 5\' 4"  (1.626 m), weight 65 kg, SpO2 91  %.  Physical Exam CONSTITUTIONAL:  Pleasant, well-developed, well-nourished, and in no acute distress. EYES: Pupils equal and reactive to light, Sclera non-icteric EARS, NOSE, MOUTH AND THROAT:  The oropharynx was clear.  Dentition is good repair.  Oral mucosa pink and moist. LYMPH NODES:  Lymph nodes in the neck and axillae were normal RESPIRATORY:  Lungs were clear on the left but markedly diminished on the right.  Normal respiratory effort without pathologic use of accessory muscles of respiration CARDIOVASCULAR: Heart was regular without murmurs.  There were no carotid bruits. GI: The abdomen was soft, nontender, and nondistended. There were no palpable masses. There was no hepatosplenomegaly. There were normal bowel sounds in all quadrants. GU:  Rectal deferred.     Data Reviewed Chest x-rays and CT scans  I have personally reviewed the patient's imaging, laboratory findings and medical records.    Assessment    I have independently reviewed the patient's CT scans of also discussed her care with the hospitalist and interventional radiologist.  I believe that she has a right-sided parapneumonic effusion will be amenable to percutaneous approach.    Plan    I had a long discussion with the patient and her healthcare power of attorney regarding the options.  I explained to them that a percutaneous approach with intrapleural thrombolytics may be able to manage her pleural effusion.  They also understand that this may not work and she may require additional surgical intervention.  I discussed this with the interventional radiologist who will try to perform this procedure today and I will try intrapleural thrombolytics tomorrow morning.  The patient and her caregivers had all their questions answered.      Hulda Marinimothy Awad Gladd, MD 03/16/2018, 11:08 AM   Patient ID: Brittany [name], female   DOB: 1950/11/13, 67 y.o.   MRN: [id number]

## 2018-03-16 NOTE — Progress Notes (Signed)
Peripherally Inserted Central Catheter/Midline Placement  The IV Nurse has discussed with the patient and/or persons authorized to consent for the patient, the purpose of this procedure and the potential benefits and risks involved with this procedure.  The benefits include less needle sticks, lab draws from the catheter, and the patient may be discharged home with the catheter. Risks include, but not limited to, infection, bleeding, blood clot (thrombus formation), and puncture of an artery; nerve damage and irregular heartbeat and possibility to perform a PICC exchange if needed/ordered by physician.  Alternatives to this procedure were also discussed.  Bard Power PICC patient education guide, fact sheet on infection prevention and patient information card has been provided to patient /or left at bedside.    PICC/Midline Placement Documentation  PICC Double Lumen 03/16/18 PICC Right Basilic 37 cm 1 cm (Active)  Exposed Catheter (cm) 1 cm 03/16/2018  9:47 PM  Site Assessment Clean;Dry;Intact 03/16/2018  9:47 PM  Lumen #1 Status Flushed;Saline locked;Blood return noted 03/16/2018  9:47 PM  Lumen #2 Status Flushed;Saline locked;Blood return noted 03/16/2018  9:47 PM  Dressing Type Transparent 03/16/2018  9:47 PM  Dressing Status Clean;Dry;Intact 03/16/2018  9:47 PM  Dressing Intervention New dressing 03/16/2018  9:47 PM  Dressing Change Due 03/23/18 03/16/2018  9:47 PM       Brittany [name], Brittany [name] 03/16/2018, 9:48 PM

## 2018-03-16 NOTE — Care Management Note (Signed)
Case Management Note  Patient Details  Name: Brittany KaufmanSharon S Lesage MRN: [id number] Date of Birth: 22-Aug-1950  Subjective/Objective:                 Patient is long term resident at [facility].  She is admitted with worseningpneumonia  and concern it is from aspiration. Pulmonary documents concern for empyema. Thoracic surgery consult pending. Possible need for chest tube. Patient with long term TBI.   Action/Plan:   Expected Discharge Date:  03/15/18               Expected Discharge Plan:     In-House Referral:     Discharge planning Services     Post Acute Care Choice:    Choice offered to:     DME Arranged:    DME Agency:     HH Arranged:    HH Agency:     Status of Service:     If discussed at [employer] Meetings, dates discussed:    Additional Comments:  Eber HongGreene, Gregory Dowe R, RN 03/16/2018, 10:13 AM

## 2018-03-16 NOTE — Progress Notes (Signed)
Pt IV got infiltrated. RN and IV team attempt to get new iv but we couldn't. Per IV team pt will need a PICC line. RN notified MD. Per MD okay to hold antibiotics until PICC line is place. Also okay for RN to place order for PICC line placement.

## 2018-03-16 NOTE — Progress Notes (Signed)
ID PT not in her room. Has gone to radiology for thoracentesis. Will see her tomorrow

## 2018-03-16 NOTE — Procedures (Signed)
Interventional Radiology Procedure Note  Procedure: Ultrasound guided placement of right chest thoracostomy tube   Complications: None  Estimated Blood Loss: < 10 mL  Findings: 14 Fr pigtail catheter advanced into right posterior pleural fluid collection with return of clear, dark yellow fluid.  Sample of 120 mL sent for lab tests.  Catheter connected to Oasis Pleur-evac.  Will connect to -20 cm H2O wall suction in room.  Jodi MarbleGlenn T. Fredia SorrowYamagata, M.D Pager:  [phone]

## 2018-03-16 NOTE — Clinical Social Work Note (Signed)
Patient is not from [facility]. Patient is actually from their independent living. Patient to receive chest tube. CSW spoke with patient and her health care power of attorney today and they would like to see if she could qualify for short term rehab. They do not wish to go to [facility] for the short term rehab. CSW has informed Dr. Elisabeth PigeonVachhani of the request to order PT.

## 2018-03-17 ENCOUNTER — Inpatient Hospital Stay: Payer: Medicare Other

## 2018-03-17 DIAGNOSIS — R479 Unspecified speech disturbances: Secondary | ICD-10-CM

## 2018-03-17 DIAGNOSIS — J181 Lobar pneumonia, unspecified organism: Secondary | ICD-10-CM

## 2018-03-17 DIAGNOSIS — Z87820 Personal history of traumatic brain injury: Secondary | ICD-10-CM

## 2018-03-17 DIAGNOSIS — Z978 Presence of other specified devices: Secondary | ICD-10-CM

## 2018-03-17 DIAGNOSIS — Z87891 Personal history of nicotine dependence: Secondary | ICD-10-CM

## 2018-03-17 DIAGNOSIS — Z9081 Acquired absence of spleen: Secondary | ICD-10-CM

## 2018-03-17 DIAGNOSIS — Z993 Dependence on wheelchair: Secondary | ICD-10-CM

## 2018-03-17 LAB — BASIC METABOLIC PANEL
Anion gap: 7 (ref 5–15)
BUN: 12 mg/dL (ref 8–23)
CHLORIDE: 103 mmol/L (ref 98–111)
CO2: 30 mmol/L (ref 22–32)
CREATININE: 0.61 mg/dL (ref 0.44–1.00)
Calcium: 8.4 mg/dL — ABNORMAL LOW (ref 8.9–10.3)
GFR calc Af Amer: >60 mL/min (ref >60)
GFR calc non Af Amer: >60 mL/min (ref >60)
GLUCOSE: 109 mg/dL — AB (ref 70–99)
Potassium: 3.1 mmol/L — ABNORMAL LOW (ref 3.5–5.1)
Sodium: 140 mmol/L (ref 135–145)

## 2018-03-17 LAB — CBC
HEMATOCRIT: 35 % (ref 35.0–47.0)
HEMOGLOBIN: 12 g/dL (ref 12.0–16.0)
MCH: 31.6 pg (ref 26.0–34.0)
MCHC: 34.2 g/dL (ref 32.0–36.0)
MCV: 92.4 fL (ref 80.0–100.0)
Platelets: 459 10*3/uL — ABNORMAL HIGH (ref 150–440)
RBC: 3.79 MIL/uL — ABNORMAL LOW (ref 3.80–5.20)
RDW: 13.2 % (ref 11.5–14.5)
WBC: 22.7 10*3/uL — ABNORMAL HIGH (ref 3.6–11.0)

## 2018-03-17 LAB — PATHOLOGIST SMEAR REVIEW

## 2018-03-17 LAB — MYCOPLASMA PNEUMONIAE ANTIBODY, IGM

## 2018-03-17 LAB — PROCALCITONIN: Procalcitonin: 0.37 ng/mL

## 2018-03-17 MED ORDER — OXYCODONE-ACETAMINOPHEN 5-325 MG PO TABS
1.0000 | ORAL_TABLET | ORAL | Status: DC | PRN
  Administered 2018-03-17 – 2018-03-21 (×11): 1 via ORAL
  Filled 2018-03-17 (×11): qty 1

## 2018-03-17 MED ORDER — SODIUM CHLORIDE 0.9 % IJ SOLN
Freq: Once | INTRAMUSCULAR | Status: AC
  Administered 2018-03-17: 09:00:00 via INTRAPLEURAL
  Filled 2018-03-17: qty 10

## 2018-03-17 MED ORDER — POTASSIUM CHLORIDE CRYS ER 20 MEQ PO TBCR
20.0000 meq | EXTENDED_RELEASE_TABLET | Freq: Two times a day (BID) | ORAL | Status: DC
  Administered 2018-03-17 – 2018-03-23 (×13): 20 meq via ORAL
  Filled 2018-03-17 (×13): qty 1
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
## 2018-03-17 NOTE — Progress Notes (Signed)
  Patient ID: Brittany [name], female   DOB: 03-31-1951, 67 y.o.   MRosalee KaufmanRN: [id number]  HISTORY: I did discuss her care yesterday with Dr. Irish LackGlenn Yamagata who was kind enough to place a percutaneous pigtail catheter.  The chest x-ray today showed improvement of the right pleural effusion but there was still a significant amount of loculated fluid present.  There is no air leak on the tube.  The patient states that she felt about the same but did not feel significantly improved.  Therefore we elected to perform the TPA today.   Vitals:   03/16/18 2009 03/17/18 0413  BP:  134/87  Pulse: (!) 125 (!) 103  Resp:  20  Temp:  98.1 F (36.7 C)  SpO2: (!) 89% 93%     EXAM:    Resp: Lungs are clear on the left and diminished at the right base..  No respiratory distress, normal effort. Heart:  Regular without murmurs Abd:  Abdomen is soft, non distended and non tender. No masses are palpable.  There is no rebound and no guarding.   She did have a chest x-ray made this morning which have independently reviewed.  There is a continued right-sided pleural effusion.  There was no pneumothorax.   ASSESSMENT: Right parapneumonic effusion in the setting of extensive right lung pneumonia   PLAN:   I did instill 10 mg of TPA through the tube this morning.  The tube was left clamped for approximately 5 hours.  After which I removed the clamp and hooked the tube back up to suction.  There was about 700 cc of cloudy slightly yellow fluid present that drained from the pleural drain.  The patient states that she had pain after this was accomplished.  We will repeat her chest x-ray tomorrow.  If the fluid is still loculated we may recommend an additional imaging study.    Brittany Marinimothy Brittany [name], MDPatient ID: Brittany KaufmanSharon S [name], female   DOB: 03-31-1951, 67 y.o.   MRN: [id number]

## 2018-03-17 NOTE — Progress Notes (Signed)
[facility] [place] [facility]   PATIENT NAME: Brittany [name]    MR#:  [id number]  DATE OF BIRTH:  Nov 28, 1950  SUBJECTIVE:  CHIEF COMPLAINT:   Chief Complaint  Patient presents with  . Abdominal Pain   Came with pneumonia, have some complains of pain in chest and abdomen.  WBCs are worsening and her xray is worse than before. Seen by thorasic surgeon- suggest to place chest tube- they spoke to IR.  REVIEW OF SYSTEMS:  CONSTITUTIONAL: No fever, fatigue or weakness.  EYES: No blurred or double vision.  EARS, NOSE, AND THROAT: No tinnitus or ear pain.  RESPIRATORY: have cough, shortness of breath,no wheezing or hemoptysis.  CARDIOVASCULAR: have chest pain, orthopnea, edema.  GASTROINTESTINAL: No nausea, vomiting, diarrhea or abdominal pain.  GENITOURINARY: No dysuria, hematuria.  ENDOCRINE: No polyuria, nocturia,  HEMATOLOGY: No anemia, easy bruising or bleeding SKIN: No rash or lesion. MUSCULOSKELETAL: No joint pain or arthritis.   NEUROLOGIC: No tingling, numbness, weakness.  PSYCHIATRY: No anxiety or depression.   ROS  DRUG ALLERGIES:   Allergies  Allergen Reactions  . Sulfa Antibiotics Nausea Only and Rash    VITALS:  Blood pressure 134/87, pulse (!) 103, temperature 98.1 F (36.7 C), temperature source Oral, resp. rate 20, height 5\' 4"  (1.626 m), weight 65 kg, SpO2 93 %.  PHYSICAL EXAMINATION:   GENERAL:  67 y.o.-year-old patient lying in the bed with no acute distress.  EYES: Pupils equal, round, reactive to light and accommodation. No scleral icterus. Extraocular muscles intact.  HEENT: Head atraumatic, normocephalic. Oropharynx and nasopharynx clear.  Speech is altered NECK:  Supple, no jugular venous distention. No thyroid enlargement, no tenderness.  LUNGS: Decreased breath sound on right side, no wheezing, some crepitation. No use of accessory muscles of respiration.  CARDIOVASCULAR: S1, S2 normal. No murmurs, rubs, or gallops.   ABDOMEN: Soft, nontender, nondistended. Bowel sounds present. No organomegaly or mass.  EXTREMITIES: No pedal edema, cyanosis, or clubbing.  NEUROLOGIC: Cranial nerves II through XII are intact. Muscle strength 5/5 in all extremities. Sensation intact. Gait not checked.  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 PSYCHIATRIC: The patient is alert and oriented x 3.  SKIN: No obvious rash, lesion, or ulcer.   Physical Exam LABORATORY PANEL:   CBC Recent Labs  Lab 03/17/18 0555  WBC 22.7*  HGB 12.0  HCT 35.0  PLT 459*   ------------------------------------------------------------------------------------------------------------------  Chemistries  Recent Labs  Lab 03/13/18 0616  03/17/18 0555  NA 139   < > 140  K 4.3   < > 3.1*  CL 103   < > 103  CO2 27   < > 30  GLUCOSE 105*   < > 109*  BUN 10   < > 12  CREATININE 0.80   < > 0.61  CALCIUM 9.3   < > 8.4*  AST 37  --   --   ALT 31  --   --   ALKPHOS 96  --   --   BILITOT 0.5  --   --    < > = values in this interval not displayed.   ------------------------------------------------------------------------------------------------------------------  Cardiac Enzymes Recent Labs  Lab 03/13/18 0616  TROPONINI <0.03   ------------------------------------------------------------------------------------------------------------------  RADIOLOGY:  Ct Chest W Contrast  Result Date: 03/15/2018 CLINICAL DATA:  Follow-up pleural effusion, pneumonia EXAM: CT CHEST WITH CONTRAST TECHNIQUE: Multidetector CT imaging of  the chest was performed during intravenous contrast administration. CONTRAST:  75mL OMNIPAQUE IOHEXOL 300 MG/ML  SOLN COMPARISON:  CT 03/13/2018.  Chest x-ray 03/15/2018 FINDINGS: Cardiovascular: Heart is normal size.  Aorta is normal caliber. Mediastinum/Nodes: Enlarging mediastinal lymph nodes. Right paratracheal lymph node has a  short axis diameter of 14 mm. Low right paratracheal lymph node has a short axis diameter of 12 mm. Subcarinal lymph node has a short axis diameter of 17 mm. These are slightly more prominent than on prior CT. Lungs/Pleura: Enlarging loculated right pleural effusion, now large in size with loculations noted posteriorly and laterally. Small left pleural effusion. Consolidation in the right middle lobe and right lower lobe similar to prior study compatible with pneumonia. Compressive atelectasis in the left lower lobe. Posterior left upper lobe nodule again noted, unchanged. Upper Abdomen: Imaging into the upper abdomen shows no acute findings. Musculoskeletal: Chest wall soft tissues are unremarkable. No acute bony abnormality. IMPRESSION: Enlarging large left pleural effusion which is loculated posteriorly and laterally. Continued consolidation in the right lower lobe and right middle lobe compatible with pneumonia, not significantly changed. Small left effusion with left base atelectasis. Stable left upper lobe nodule. Mediastinal adenopathy, presumably reactive. This could be followed on subsequent imaging. Electronically Signed   By: Charlett Nose M.D.   On: 03/15/2018 13:25   [place] Chest (pleural Effusion)  Result Date: 03/15/2018 CLINICAL DATA:  Increasing right-sided pleural effusion EXAM: CHEST ULTRASOUND COMPARISON:  CT from earlier in the same day. FINDINGS: There is an enlarging right-sided pleural effusion. Initial scanning was performed as a prelude to possible thoracentesis although the fluid is shown to be significantly multiloculated and not likely to respond adequately to percutaneous drainage. Large bore chest tube drainage is likely necessary. IMPRESSION: Significant loculated increased pleural effusion on the right similar to that seen on recent chest CT. Small bore catheter drainage is not likely to be significantly helpful for the patient. These findings were communicated to Dr. Elisabeth Pigeon at the  time of exam performance. Electronically Signed   By: Alcide Clever M.D.   On: 03/15/2018 15:58   Dg Chest Port 1 View  Result Date: 03/17/2018 CLINICAL DATA:  History of pneumonia. EXAM: PORTABLE CHEST 1 VIEW COMPARISON:  Chest x-ray 03/15/2018.  Chest CT 03/15/2018. FINDINGS: Right PICC line noted with tip over cavoatrial junction. Right chest tube noted over the mid right chest. Previously identified large loculated pleural fluid collection appears resolved significantly. Right base atelectasis/infiltrate noted. Small bilateral pleural effusions. IMPRESSION: 1. Right PICC line noted with tip over cavoatrial junction. Right chest tube noted over the right mid chest. Previously identified large loculated right pleural effusion appears to have resolved significantly. Small bilateral pleural effusions. 2.  Right base atelectasis/infiltrate. Electronically Signed   By: Maisie Fus  Register   On: 03/17/2018 08:04   [place] Ekg Site Rite  Result Date: 03/16/2018 If Site Rite image not attached, placement could not be confirmed due to current cardiac rhythm.  [place] Perc Pleural Drain W/indwell Cath W/img Guide  Result Date: 03/16/2018 INDICATION: Loculated right pleural effusion demonstrating complex appearance by ultrasound. Request has been made to place a thoracostomy tube for drainage purposes. EXAM: ULTRASOUND-GUIDED PLACEMENT OF RIGHT PLEURAL THORACOSTOMY TUBE MEDICATIONS: The patient is currently admitted to the hospital and receiving intravenous antibiotics. The antibiotics were administered within an appropriate time frame prior to the initiation of the procedure. ANESTHESIA/SEDATION: Fentanyl 75 mcg IV; Versed 2.0 mg IV Moderate Sedation Time:  21 minutes. The patient was continuously monitored during  the procedure by the interventional radiology nurse under my direct supervision. COMPLICATIONS: None immediate. PROCEDURE: Informed written consent was obtained from the patient after a thorough discussion of the  procedural risks, benefits and alternatives. All questions were addressed. Maximal Sterile Barrier Technique was utilized including caps, mask, sterile gowns, sterile gloves, sterile drape, hand hygiene and skin antiseptic. A timeout was performed prior to the initiation of the procedure. Ultrasound was performed of the right pleural space with the patient in a decubitus position with the right side up. Local anesthesia was provided with 1% lidocaine. Under ultrasound guidance, an 18 gauge trocar needle was advanced into the right pleural space from a posterior approach. After return of fluid, a guidewire was advanced. The tract was dilated over the wire and a 14 French pigtail drainage catheter advanced. Catheter course was confirmed by ultrasound. A 120 mL sample of fluid was sent for requested laboratory testing. The catheter was connected to a Pleur-evac device. The catheter was secured at the skin with a Prolene retention suture and StatLock device. FINDINGS: Complex appearing loculated pleural effusion again present by ultrasound. There was return of clear appearing dark yellow fluid. IMPRESSION: Placement of 14 French right thoracostomy tube to treat each a loculated right pleural effusion. A fluid sample was sent for requested laboratory testing. Electronically Signed   By: Irish LackGlenn  Yamagata M.D.   On: 03/16/2018 17:04    ASSESSMENT AND PLAN:   Principal Problem:   Community acquired pneumonia Active Problems:   Pneumonia   Pleural effusion  *Sepsis due to community-acquired pneumonia As WBCs are rising- Changed to zosyn to cover for possible aspiration pneumonia.  Added vancomycin and azithromycin.  Awaited ID recommendation.  * Pleural effusion   Due to worsening pneumonia, ordered and reviewed CT chest- large right sided pleural effusion. Spoke to pt, her legal POA- Mark and her mother, also discussed with Pulm Dr. Welton FlakesKhan and Radiologist for need of Pleural tap. Later notified about the  collection being loculated, and possible need of chest tube- called Consult with thorasic surgery. Plan for tunneled catheter placement later today.  *Tachycardia Due to sepsis, give IV fluids.  *Dysphagia Due to traumatic brain injury, continue her diet as per preadmission.    All the records are reviewed and case discussed with Care Management/Social Workerr. Management plans discussed with the patient, family and they are in agreement.  CODE STATUS: Full.  TOTAL TIME TAKING CARE OF THIS PATIENT: 35 minutes.    POSSIBLE D/C IN 1-2 DAYS, DEPENDING ON CLINICAL CONDITION.   Altamese DillingVaibhavkumar Razia Screws M.D on 03/17/2018   Between 7am to 6pm - Pager - [phone]  After 6pm go to [email or web address] - password [email or web address]  [facility]  Office  [phone]  CC: Primary care physician; Danella PentonMiller, Mark F, MD  Note: This dictation was prepared with Dragon dictation along with smaller phrase technology. Any transcriptional errors that result from this process are unintentional.

## 2018-03-18 ENCOUNTER — Inpatient Hospital Stay: Payer: Medicare Other

## 2018-03-18 LAB — BASIC METABOLIC PANEL
Anion gap: 6 (ref 5–15)
BUN: 12 mg/dL (ref 8–23)
CALCIUM: 8.5 mg/dL — AB (ref 8.9–10.3)
CO2: 32 mmol/L (ref 22–32)
Chloride: 103 mmol/L (ref 98–111)
Creatinine, Ser: 0.66 mg/dL (ref 0.44–1.00)
GFR calc Af Amer: >60 mL/min (ref >60)
GFR calc non Af Amer: >60 mL/min (ref >60)
GLUCOSE: 119 mg/dL — AB (ref 70–99)
POTASSIUM: 3.6 mmol/L (ref 3.5–5.1)
Sodium: 141 mmol/L (ref 135–145)

## 2018-03-18 LAB — CULTURE, BLOOD (ROUTINE X 2)
Culture: NO GROWTH
Culture: NO GROWTH
SPECIAL REQUESTS: ADEQUATE
Special Requests: ADEQUATE

## 2018-03-18 LAB — CBC
HCT: 35.2 % (ref 35.0–47.0)
Hemoglobin: 12.3 g/dL (ref 12.0–16.0)
MCH: 32.5 pg (ref 26.0–34.0)
MCHC: 35.1 g/dL (ref 32.0–36.0)
MCV: 92.8 fL (ref 80.0–100.0)
PLATELETS: 449 10*3/uL — AB (ref 150–440)
RBC: 3.79 MIL/uL — ABNORMAL LOW (ref 3.80–5.20)
RDW: 13.2 % (ref 11.5–14.5)
WBC: 21.9 10*3/uL — ABNORMAL HIGH (ref 3.6–11.0)

## 2018-03-18 LAB — ACID FAST SMEAR (AFB, MYCOBACTERIA): Acid Fast Smear: NEGATIVE

## 2018-03-18 LAB — VANCOMYCIN, TROUGH: Vancomycin Tr: 5 ug/mL — ABNORMAL LOW (ref 15–20)

## 2018-03-18 MED ORDER — VANCOMYCIN HCL 10 G IV SOLR
1250.0000 mg | Freq: Two times a day (BID) | INTRAVENOUS | Status: DC
  Administered 2018-03-18 – 2018-03-20 (×4): 1250 mg via INTRAVENOUS
  Filled 2018-03-18 (×6): qty 1250

## 2018-03-18 MED ORDER — SODIUM CHLORIDE 0.9 % IV SOLN
INTRAVENOUS | Status: DC | PRN
  Administered 2018-03-18 – 2018-03-21 (×8): via INTRAVENOUS

## 2018-03-18 MED ORDER — SODIUM CHLORIDE 0.9 % IV SOLN
2.0000 g | Freq: Three times a day (TID) | INTRAVENOUS | Status: DC
  Administered 2018-03-18 – 2018-03-21 (×8): 2 g via INTRAVENOUS
  Filled 2018-03-18 (×10): qty 2

## 2018-03-18 MED ORDER — METRONIDAZOLE IN NACL 5-0.79 MG/ML-% IV SOLN
500.0000 mg | Freq: Three times a day (TID) | INTRAVENOUS | Status: DC
  Administered 2018-03-18 – 2018-03-21 (×8): 500 mg via INTRAVENOUS
  Filled 2018-03-18 (×11): qty 100

## 2018-03-18 NOTE — Progress Notes (Signed)
Pharmacy Antibiotic Note  Brittany KaufmanSharon S Holwerda is a 67 y.o. female admitted on 03/13/2018 with IAI.  Pharmacy has been consulted for Zosyn dosing. 03/15/18 due to worsening effusion and continued consolidation in RLL and RML (unchanged pneumonia) pharmacy has also been consulted to dose vancomycin.  Plan: Per ID stop Zosyn/azithromycin, switch to cefepime/metronidazole.   Vancomycin trough resulted 5 mcg/ml. Doses look like they were timed about appropriately. Increase to vancomycin 1.25 gm IV Q12H, predicted trough 15 mcg/ml. Pharmacy will continue to follow and adjust as needed to maintain trough 15 to 20 mcg/ml.   Vd 37.9 L, Ke 0.102 hr-1, T1/2 6.8 hr  Height: 5\' 4"  (162.6 cm) Weight: 143 lb 4.8 oz (65 kg) IBW/kg (Calculated) : 54.7  Temp (24hrs), Avg:98.4 F (36.9 C), Min:98.3 F (36.8 C), Max:98.5 F (36.9 C)  Recent Labs  Lab 03/13/18 1107 03/14/18 0531 03/15/18 0447 03/16/18 0941 03/17/18 0555 03/18/18 1113  WBC 21.1* 31.1* 36.1* 31.7* 22.7* 21.9*  CREATININE 0.74 0.69 0.62  --  0.61 0.66  LATICACIDVEN 1.4  --   --   --   --   --   VANCOTROUGH  --   --   --   --   --  5*    Estimated Creatinine Clearance: 61.3 mL/min (by C-G formula based on SCr of 0.66 mg/dL).    Allergies  Allergen Reactions  . Sulfa Antibiotics Nausea Only and Rash    Antimicrobials this admission: 8/18 Received vanc/cefepime x 1 in ED 8/18 ceftriaxone and azithromycin were given on admission 8/19 abx changed to Zosyn due to rising WBC - cover for aspiration 8/20 addvancomycin due to worsening CT chest with progressing effusions and continued PNA  Dose adjustments this admission:  Microbiology results: 8/18 BCx: NGTD  UCx:    Sputum:   8/18 MRSA PCR: negative  Thank you for allowing pharmacy to be a part of this patient's care.  Carola FrostNathan A Jarel Cuadra, Pharm.D., BCPS Clinical Pharmacist 03/18/2018 1:30 PM

## 2018-03-18 NOTE — Clinical Social Work Note (Signed)
CSW was informed by PT that patient has no rehab needs at this time. Patient would not be eligible for skilled nursing home placement unless patient pays out of pocket. York SpanielMonica Wanell Lorenzi MSW,LCSW [phone]

## 2018-03-18 NOTE — Care Management Important Message (Signed)
Important Message  Patient Details  Name: Brittany [name] MRN: [id number] Date of Birth: 12/08/1950   Medicare Important Message Given:  Yes    Olegario MessierKathy A Matti Killingsworth 03/18/2018, 11:58 AM

## 2018-03-18 NOTE — Progress Notes (Signed)
[employer] [facility] - [place] at [facility]   PATIENT NAME: Brittany PancakeSharon [name]    MR#:  [id number]  DATE OF BIRTH:  [birth year]  SUBJECTIVE:  CHIEF COMPLAINT:   Chief Complaint  Patient presents with  . Abdominal Pain   Came with pneumonia, have some complains of pain in chest and abdomen.  WBCs are worsening and her xray is worse than before. Seen by thorasic surgeon- suggest to place chest tube- placed by IR, had good drain but still feels loculated fluids, TPA given today in the tube, pt feels much better.  REVIEW OF SYSTEMS:  CONSTITUTIONAL: No fever, fatigue or weakness.  EYES: No blurred or double vision.  EARS, NOSE, AND THROAT: No tinnitus or ear pain.  RESPIRATORY: have cough, shortness of breath,no wheezing or hemoptysis.  CARDIOVASCULAR: have chest pain, orthopnea, edema.  GASTROINTESTINAL: No nausea, vomiting, diarrhea or abdominal pain.  GENITOURINARY: No dysuria, hematuria.  ENDOCRINE: No polyuria, nocturia,  HEMATOLOGY: No anemia, easy bruising or bleeding SKIN: No rash or lesion. MUSCULOSKELETAL: No joint pain or arthritis.   NEUROLOGIC: No tingling, numbness, weakness.  PSYCHIATRY: No anxiety or depression.   ROS  DRUG ALLERGIES:   Allergies  Allergen Reactions  . Sulfa Antibiotics Nausea Only and Rash    VITALS:  Blood pressure 115/62, pulse (!) 107, temperature 98.5 F (36.9 C), temperature source Oral, resp. rate 18, height 5\' 4"  (1.626 m), weight 65 kg, SpO2 93 %.  PHYSICAL EXAMINATION:   GENERAL:  67 y.o.-year-old patient lying in the bed with no acute distress.  EYES: Pupils equal, round, reactive to light and accommodation. No scleral icterus. Extraocular muscles intact.  HEENT: Head atraumatic, normocephalic. Oropharynx and nasopharynx clear.  Speech is altered NECK:  Supple, no jugular venous distention. No thyroid enlargement, no tenderness.  LUNGS: Decreased breath sound on right side, no wheezing, some crepitation. No use of accessory  muscles of respiration.  CARDIOVASCULAR: S1, S2 normal. No murmurs, rubs, or gallops.  ABDOMEN: Soft, nontender, nondistended. Bowel sounds present. No organomegaly or mass.  EXTREMITIES: No pedal edema, cyanosis, or clubbing.  NEUROLOGIC: Cranial nerves II through XII are intact. Muscle strength 5/5 in all extremities. Sensation intact. Gait not checked.  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 PSYCHIATRIC: The patient is alert and oriented x 3.  SKIN: No obvious rash, lesion, or ulcer.   Physical Exam LABORATORY PANEL:   CBC Recent Labs  Lab 03/17/18 0555  WBC 22.7*  HGB 12.0  HCT 35.0  PLT 459*   ------------------------------------------------------------------------------------------------------------------  Chemistries  Recent Labs  Lab 03/13/18 0616  03/17/18 0555  NA 139   < > 140  K 4.3   < > 3.1*  CL 103   < > 103  CO2 27   < > 30  GLUCOSE 105*   < > 109*  BUN 10   < > 12  CREATININE 0.80   < > 0.61  CALCIUM 9.3   < > 8.4*  AST 37  --   --   ALT 31  --   --   ALKPHOS 96  --   --   BILITOT 0.5  --   --    < > = values in this interval not displayed.   ------------------------------------------------------------------------------------------------------------------  Cardiac Enzymes Recent Labs  Lab 03/13/18 0616  TROPONINI <0.03   ------------------------------------------------------------------------------------------------------------------  RADIOLOGY:  Dg Chest Port 1 View  Result Date:  03/18/2018 CLINICAL DATA:  Right chest tube. EXAM: PORTABLE CHEST 1 VIEW COMPARISON:  03/17/2018. FINDINGS: Right PICC line and right chest tube in stable position. No pneumothorax. Right base atelectasis/infiltrate, improved from prior exam. Mild left base atelectasis. Small bilateral pleural effusions again noted. Heart size stable. IMPRESSION: One right PICC line right  chest pneumothorax.Again previously identified loculated right pleural fluid collections have resolved. 2. Right base atelectasis/infiltrate, improved from prior exam. Mild left base atelectasis. Small bilateral pleural effusions again noted. Electronically Signed   By: Maisie Fus  Register   On: 03/18/2018 08:06   Dg Chest Port 1 View  Result Date: 03/17/2018 CLINICAL DATA:  History of pneumonia. EXAM: PORTABLE CHEST 1 VIEW COMPARISON:  Chest x-ray 03/15/2018.  Chest CT 03/15/2018. FINDINGS: Right PICC line noted with tip over cavoatrial junction. Right chest tube noted over the mid right chest. Previously identified large loculated pleural fluid collection appears resolved significantly. Right base atelectasis/infiltrate noted. Small bilateral pleural effusions. IMPRESSION: 1. Right PICC line noted with tip over cavoatrial junction. Right chest tube noted over the right mid chest. Previously identified large loculated right pleural effusion appears to have resolved significantly. Small bilateral pleural effusions. 2.  Right base atelectasis/infiltrate. Electronically Signed   By: Maisie Fus  Register   On: 03/17/2018 08:04   [place] Ekg Site Rite  Result Date: 03/16/2018 If Site Rite image not attached, placement could not be confirmed due to current cardiac rhythm.  [place] Perc Pleural Drain W/indwell Cath W/img Guide  Result Date: 03/16/2018 INDICATION: Loculated right pleural effusion demonstrating complex appearance by ultrasound. Request has been made to place a thoracostomy tube for drainage purposes. EXAM: ULTRASOUND-GUIDED PLACEMENT OF RIGHT PLEURAL THORACOSTOMY TUBE MEDICATIONS: The patient is currently admitted to the hospital and receiving intravenous antibiotics. The antibiotics were administered within an appropriate time frame prior to the initiation of the procedure. ANESTHESIA/SEDATION: Fentanyl 75 mcg IV; Versed 2.0 mg IV Moderate Sedation Time:  21 minutes. The patient was continuously monitored  during the procedure by the interventional radiology nurse under my direct supervision. COMPLICATIONS: None immediate. PROCEDURE: Informed written consent was obtained from the patient after a thorough discussion of the procedural risks, benefits and alternatives. All questions were addressed. Maximal Sterile Barrier Technique was utilized including caps, mask, sterile gowns, sterile gloves, sterile drape, hand hygiene and skin antiseptic. A timeout was performed prior to the initiation of the procedure. Ultrasound was performed of the right pleural space with the patient in a decubitus position with the right side up. Local anesthesia was provided with 1% lidocaine. Under ultrasound guidance, an 18 gauge trocar needle was advanced into the right pleural space from a posterior approach. After return of fluid, a guidewire was advanced. The tract was dilated over the wire and a 14 French pigtail drainage catheter advanced. Catheter course was confirmed by ultrasound. A 120 mL sample of fluid was sent for requested laboratory testing. The catheter was connected to a Pleur-evac device. The catheter was secured at the skin with a Prolene retention suture and StatLock device. FINDINGS: Complex appearing loculated pleural effusion again present by ultrasound. There was return of clear appearing dark yellow fluid. IMPRESSION: Placement of 14 French right thoracostomy tube to treat each a loculated right pleural effusion. A fluid sample was sent for requested laboratory testing. Electronically Signed   By: Irish Lack M.D.   On: 03/16/2018 17:04    ASSESSMENT AND PLAN:   Principal Problem:   Community acquired pneumonia Active Problems:   Pneumonia   Pleural effusion  *  Sepsis due to community-acquired pneumonia As WBCs are rising- Changed to zosyn to cover for possible aspiration pneumonia.  Added vancomycin and azithromycin.  Awaited ID recommendation.  * Pleural effusion   Due to worsening pneumonia,  ordered and reviewed CT chest- large right sided pleural effusion. Spoke to pt, her legal POA- Mark and her mother, also discussed with Pulm Dr. Welton Flakes and Radiologist for need of Pleural tap. Later notified about the collection being loculated, and possible need of chest tube- called Consult with thorasic surgery. Tunneled cath placed by radiology. Dr. Thelma Barge ha given TPA in tube today. Good discharge.  *Tachycardia Due to sepsis, give IV fluids.  *Dysphagia Due to traumatic brain injury, continue her diet as per preadmission.    All the records are reviewed and case discussed with Care Management/Social Workerr. Management plans discussed with the patient, family and they are in agreement.  CODE STATUS: Full.  TOTAL TIME TAKING CARE OF THIS PATIENT: 35 minutes.    POSSIBLE D/C IN 1-2 DAYS, DEPENDING ON CLINICAL CONDITION.   Altamese Dilling M.D on 03/18/2018   Between 7am to 6pm - Pager - [phone]  After 6pm go to [email or web address] - password [email or web address]  [facility] [place] [facility]  Office  [phone]  CC: Primary care physician; Danella Penton, MD  Note: This dictation was prepared with Dragon dictation along with smaller phrase technology. Any transcriptional errors that result from this process are unintentional.

## 2018-03-18 NOTE — Progress Notes (Signed)
Patient ID: Brittany KaufmanSharon S Lazcano, female   DOB: 07/16/1951, 67 y.o.   MRN: [id number]  Patient ID: Brittany KaufmanSharon S Antone, female   DOB: 07/16/1951, 67 y.o.   MRN: [id number]  HISTORY: She states that she feels about the same.  She has not noticed any significant improvement despite the fact that her chest tube drained almost 1.5 L after instillation of TPA.   Vitals:   03/17/18 2003 03/18/18 0601  BP: 132/90 115/62  Pulse: (!) 112 (!) 107  Resp: 18 18  Temp: 98.3 F (36.8 C) 98.5 F (36.9 C)  SpO2: 93% 93%     EXAM:    Resp: Lungs are clear on the left but diminished at the right base..  No respiratory distress, normal effort. Heart:  Regular without murmurs Abd:  Abdomen is soft, non distended and non tender. No masses are palpable.  There is no rebound and no guarding.   I have independently reviewed her chest x-ray.  It shows a small right pleural effusion along with some pneumonic process in the right lower lobe.   ASSESSMENT: Pneumonic pleural effusion right side   PLAN:   I do not think I will place TPA today.  We will continue to monitor clinically.  The tube should remain to suction when she is in bed but if she were to desire physical therapy and ambulate it may be placed to waterseal.  I will await the results of the cultures.    Hulda Marinimothy Deryn Massengale, MD

## 2018-03-18 NOTE — Progress Notes (Signed)
     ID: Brittany [name] is a 67 y.o. female   Principal Problem:   Community acquired pneumonia Active Problems:   Pneumonia   Pleural effusion S/p thoracentesis    Subjective: Still has pain rt side of chest at the site of the drain No fever No couhg Breathing better  Medications:  . diazepam  5 mg Oral QHS  . potassium chloride  20 mEq Oral BID  . sodium chloride flush  10-40 mL Intracatheter Q12H    Objective: Vital signs in last 24 hours: Temp:  [98.3 F (36.8 C)-98.5 F (36.9 C)] 98.5 F (36.9 C) (08/23 0601) Pulse Rate:  [107-112] 107 (08/23 0601) Resp:  [18] 18 (08/23 0601) BP: (115-132)/(62-90) 115/62 (08/23 0601) SpO2:  [93 %] 93 % (08/23 0601)   awake and alert No distress  Lab Results CBC Latest Ref Rng & Units 03/18/2018 03/17/2018 03/16/2018  WBC 3.6 - 11.0 K/uL 21.9(H) 22.7(H) 31.7(H)  Hemoglobin 12.0 - 16.0 g/dL 16.112.3 09.612.0 04.512.8  Hematocrit 35.0 - 47.0 % 35.2 35.0 37.3  Platelets 150 - 440 K/uL 449(H) 459(H) 487(H)   Liver Panel No results for input(s): PROT, ALBUMIN, AST, ALT, ALKPHOS, BILITOT, BILIDIR, IBILI in the last 72 hours. Sedimentation Rate No results for input(s): ESRSEDRATE in the last 72 hours. C-Reactive Protein No results for input(s): CRP in the last 72 hours.  [facility] Pleural fluid- exudate  Studies/Results: Dg Chest Port 1 View  Result Date: 03/18/2018 CLINICAL DATA:  Right chest tube. EXAM: PORTABLE CHEST 1 VIEW COMPARISON:  03/17/2018. FINDINGS: Right PICC line and right chest tube in stable position. No pneumothorax. Right base atelectasis/infiltrate, improved from prior exam. Mild left base atelectasis. Small bilateral pleural effusions again noted. Heart size stable. IMPRESSION: One right PICC line right chest pneumothorax.Again previously identified loculated right pleural fluid collections have resolved. 2. Right base atelectasis/infiltrate, improved from prior exam. Mild left base atelectasis.  Small bilateral pleural effusions again noted. Electronically Signed   By: Maisie Fushomas  Register   On: 03/18/2018 08:06    Assessment/Plan: 67 y.o. female with a history of traumatic brain injury,speech impediment, splenectomy  wheel chair bound in  A group home was admitted to the hospital with worsening pneumonia even after starting on levaquin by PCP. PT continued to have worsening cough and rt sided chest pain.  Rt lung pneumonia with parapneumonic effusion/empyema - from group home- common organisms pneumococcus, sta[h and gram neg.with splenectomy capsulated organisms are a concern- adequately covered  On vanco, zosyn and zithro- -change to vanco+ cefepime + flagyl continue IV antibiotics until fluid culture results MRSA nares neg- so less likely MRSA pneumonia ( but wait for pleural fluid to finalize) Depending on the fluid culture will recommend PO antibiotic-  She will need atleast 2-3 weeks of antibiotics  Has rt chest drain  TBI   Discussed the management with her care taker at bed side and with Dr.Vachhani Call On call ID over the weekend if needed  [facility] Surgery Center LtdJAYASHREE Mical [name]

## 2018-03-18 NOTE — Progress Notes (Signed)
[employer] - [place] at [facility]   PATIENT NAME: Brittany [name]    MR#:  [id number]  DATE OF BIRTH:  [birth year]  SUBJECTIVE:  CHIEF COMPLAINT:   Chief Complaint  Patient presents with  . Abdominal Pain   Came with pneumonia, have some complains of pain in chest and abdomen.  WBCs are worsening and her xray is worse than before. Seen by thorasic surgeon- suggest to place chest tube- placed by IR, had good drain but still feels loculated fluids, TPA given today in the tube, pt feels much better. Still have soreness on chest. > 1500 ml fluid came out so far.  REVIEW OF SYSTEMS:  CONSTITUTIONAL: No fever, fatigue or weakness.  EYES: No blurred or double vision.  EARS, NOSE, AND THROAT: No tinnitus or ear pain.  RESPIRATORY: have cough, shortness of breath,no wheezing or hemoptysis.  CARDIOVASCULAR: have chest pain, orthopnea, edema.  GASTROINTESTINAL: No nausea, vomiting, diarrhea or abdominal pain.  GENITOURINARY: No dysuria, hematuria.  ENDOCRINE: No polyuria, nocturia,  HEMATOLOGY: No anemia, easy bruising or bleeding SKIN: No rash or lesion. MUSCULOSKELETAL: No joint pain or arthritis.   NEUROLOGIC: No tingling, numbness, weakness.  PSYCHIATRY: No anxiety or depression.   ROS  DRUG ALLERGIES:   Allergies  Allergen Reactions  . Sulfa Antibiotics Nausea Only and Rash    VITALS:  Blood pressure 114/69, pulse 98, temperature 98.8 F (37.1 C), temperature source Oral, resp. rate (!) 22, height 5\' 4"  (1.626 m), weight 65 kg, SpO2 96 %.  PHYSICAL EXAMINATION:   GENERAL:  67 y.o.-year-old patient lying in the bed with no acute distress.  EYES: Pupils equal, round, reactive to light and accommodation. No scleral icterus. Extraocular muscles intact.  HEENT: Head atraumatic, normocephalic. Oropharynx and nasopharynx clear.  Speech is altered NECK:  Supple, no jugular venous distention. No thyroid enlargement, no tenderness.  LUNGS: equal breath sounds both  side, no wheezing, some crepitation. No use of accessory muscles of respiration.  CARDIOVASCULAR: S1, S2 normal. No murmurs, rubs, or gallops.  ABDOMEN: Soft, nontender, nondistended. Bowel sounds present. No organomegaly or mass.  EXTREMITIES: No pedal edema, cyanosis, or clubbing.  NEUROLOGIC: Cranial nerves II through XII are intact. Muscle strength 5/5 in all extremities. Sensation intact. Gait not checked.  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 PSYCHIATRIC: The patient is alert and oriented x 3.  SKIN: No obvious rash, lesion, or ulcer.   Physical Exam LABORATORY PANEL:   CBC Recent Labs  Lab 03/18/18 1113  WBC 21.9*  HGB 12.3  HCT 35.2  PLT 449*   ------------------------------------------------------------------------------------------------------------------  Chemistries  Recent Labs  Lab 03/13/18 0616  03/18/18 1113  NA 139   < > 141  K 4.3   < > 3.6  CL 103   < > 103  CO2 27   < > 32  GLUCOSE 105*   < > 119*  BUN 10   < > 12  CREATININE 0.80   < > 0.66  CALCIUM 9.3   < > 8.5*  AST 37  --   --   ALT 31  --   --   ALKPHOS 96  --   --   BILITOT 0.5  --   --    < > = values in this interval not displayed.   ------------------------------------------------------------------------------------------------------------------  Cardiac Enzymes Recent Labs  Lab 03/13/18 0616  TROPONINI <0.03   ------------------------------------------------------------------------------------------------------------------  RADIOLOGY:  Dg Chest Port 1 View  Result Date: 03/18/2018 CLINICAL DATA:  Right chest tube. EXAM: PORTABLE CHEST 1 VIEW COMPARISON:  03/17/2018. FINDINGS: Right PICC line and right chest tube in stable position. No pneumothorax. Right base atelectasis/infiltrate, improved from prior exam. Mild left base atelectasis. Small bilateral pleural effusions again noted.  Heart size stable. IMPRESSION: One right PICC line right chest pneumothorax.Again previously identified loculated right pleural fluid collections have resolved. 2. Right base atelectasis/infiltrate, improved from prior exam. Mild left base atelectasis. Small bilateral pleural effusions again noted. Electronically Signed   By: Maisie Fushomas  Register   On: 03/18/2018 08:06   Dg Chest Port 1 View  Result Date: 03/17/2018 CLINICAL DATA:  History of pneumonia. EXAM: PORTABLE CHEST 1 VIEW COMPARISON:  Chest x-ray 03/15/2018.  Chest CT 03/15/2018. FINDINGS: Right PICC line noted with tip over cavoatrial junction. Right chest tube noted over the mid right chest. Previously identified large loculated pleural fluid collection appears resolved significantly. Right base atelectasis/infiltrate noted. Small bilateral pleural effusions. IMPRESSION: 1. Right PICC line noted with tip over cavoatrial junction. Right chest tube noted over the right mid chest. Previously identified large loculated right pleural effusion appears to have resolved significantly. Small bilateral pleural effusions. 2.  Right base atelectasis/infiltrate. Electronically Signed   By: Maisie Fushomas  Register   On: 03/17/2018 08:04    ASSESSMENT AND PLAN:   Principal Problem:   Community acquired pneumonia Active Problems:   Pneumonia   Pleural effusion on right  *Sepsis due to community-acquired pneumonia As WBCs are rising- Changed to zosyn to cover for possible aspiration pneumonia.  Added vancomycin and azithromycin.  Appreciated ID recommendation. Advise to cont the same until cx are available. Pt have PICC line.  * Pleural effusion   Due to worsening pneumonia, ordered and reviewed CT chest- large right sided pleural effusion. Spoke to pt, her legal POA- Mark and her mother, also discussed with Pulm Dr. Welton FlakesKhan and Radiologist for need of Pleural tap. Later notified about the collection being loculated, and possible need of chest tube- called  Consult with thorasic surgery. Tunneled cath placed by radiology. Dr. [name] had given TPA in tube. Good discharge.  *Tachycardia Due to sepsis, give IV fluids.  *Dysphagia Due to traumatic brain injury, continue her diet as per preadmission.    All the records are reviewed and case discussed with Care Management/Social Workerr. Management plans discussed with the patient, family and they are in agreement.  CODE STATUS: Full.  TOTAL TIME TAKING CARE OF THIS PATIENT: 35 minutes.    POSSIBLE D/C IN 1-2 DAYS, DEPENDING ON CLINICAL CONDITION.   Altamese DillingVaibhavkumar Mustafa Potts M.D on 03/18/2018   Between 7am to 6pm - Pager - [phone]  After 6pm go to [email or web address] - password [email or web address]  [facility] [place] [facility]  Office  [phone]  CC: Primary care physician; Danella PentonMiller, Mark F, MD  Note: This dictation was prepared with Dragon dictation along with smaller phrase technology. Any transcriptional errors that result from this process are unintentional.

## 2018-03-19 ENCOUNTER — Inpatient Hospital Stay: Payer: Medicare Other

## 2018-03-19 LAB — CBC
HEMATOCRIT: 33.7 % — AB (ref 35.0–47.0)
HEMOGLOBIN: 11.6 g/dL — AB (ref 12.0–16.0)
MCH: 32.3 pg (ref 26.0–34.0)
MCHC: 34.3 g/dL (ref 32.0–36.0)
MCV: 94.4 fL (ref 80.0–100.0)
Platelets: 451 10*3/uL — ABNORMAL HIGH (ref 150–440)
RBC: 3.57 MIL/uL — ABNORMAL LOW (ref 3.80–5.20)
RDW: 13.5 % (ref 11.5–14.5)
WBC: 18.8 10*3/uL — ABNORMAL HIGH (ref 3.6–11.0)

## 2018-03-19 MED ORDER — IOHEXOL 300 MG/ML  SOLN
75.0000 mL | Freq: Once | INTRAMUSCULAR | Status: AC | PRN
  Administered 2018-03-19: 75 mL via INTRAVENOUS

## 2018-03-19 MED ORDER — ENOXAPARIN SODIUM 40 MG/0.4ML ~~LOC~~ SOLN
40.0000 mg | Freq: Every day | SUBCUTANEOUS | Status: DC
  Administered 2018-03-19: 40 mg via SUBCUTANEOUS
  Filled 2018-03-19: qty 0.4

## 2018-03-19 MED ORDER — ENOXAPARIN SODIUM 40 MG/0.4ML ~~LOC~~ SOLN
40.0000 mg | Freq: Every day | SUBCUTANEOUS | Status: DC
  Administered 2018-03-20 – 2018-03-22 (×3): 40 mg via SUBCUTANEOUS
  Filled 2018-03-19 (×3): qty 0.4

## 2018-03-19 MED ORDER — SALINE SPRAY 0.65 % NA SOLN
1.0000 | NASAL | Status: DC | PRN
  Administered 2018-03-19 – 2018-03-21 (×3): 1 via NASAL
  Filled 2018-03-19: qty 44

## 2018-03-19 NOTE — Progress Notes (Signed)
[employer] [facility] - [place] at [facility]   PATIENT NAME: Brittany [name]    MR#:  [id number]  DATE OF BIRTH:  February 17, 1951  SUBJECTIVE:  CHIEF COMPLAINT:   Chief Complaint  Patient presents with  . Abdominal Pain   Fluid output from the chest tube Continues to have pain at the chest tube insertion site REVIEW OF SYSTEMS:  CONSTITUTIONAL: No fever, fatigue or weakness.  EYES: No blurred or double vision.  EARS, NOSE, AND THROAT: No tinnitus or ear pain.  RESPIRATORY: have cough, shortness of breath,no wheezing or hemoptysis.  CARDIOVASCULAR: Positive chest pain, orthopnea, edema.  GASTROINTESTINAL: No nausea, vomiting, diarrhea or abdominal pain.  GENITOURINARY: No dysuria, hematuria.  ENDOCRINE: No polyuria, nocturia,  HEMATOLOGY: No anemia, easy bruising or bleeding SKIN: No rash or lesion. MUSCULOSKELETAL: No joint pain or arthritis.   NEUROLOGIC: No tingling, numbness, weakness.  PSYCHIATRY: No anxiety or depression.   ROS  DRUG ALLERGIES:   Allergies  Allergen Reactions  . Sulfa Antibiotics Nausea Only and Rash    VITALS:  Blood pressure 132/65, pulse (!) 103, temperature 97.8 F (36.6 C), temperature source Oral, resp. rate 19, height 5\' 4"  (1.626 m), weight 65 kg, SpO2 100 %.  PHYSICAL EXAMINATION:   GENERAL:  67 y.o.-year-old patient lying in the bed with no acute distress.  EYES: Pupils equal, round, reactive to light and accommodation. No scleral icterus. Extraocular muscles intact.  HEENT: Head atraumatic, normocephalic. Oropharynx and nasopharynx clear.  Speech is altered NECK:  Supple, no jugular venous distention. No thyroid enlargement, no tenderness.  LUNGS: equal breath sounds both side, no wheezing, some crepitation. No use of accessory muscles of respiration.  Chest tube in place CARDIOVASCULAR: S1, S2 normal. No murmurs, rubs, or gallops.  ABDOMEN: Soft, nontender, nondistended. Bowel sounds present. No organomegaly or mass.   EXTREMITIES: No pedal edema, cyanosis, or clubbing.  NEUROLOGIC: Cranial nerves II through XII are intact. Muscle strength 5/5 in all extremities. Sensation intact. Gait not checked.  Her speech is slurred PSYCHIATRIC: The patient is alert and awake SKIN: No obvious rash, lesion, or ulcer.   Physical Exam LABORATORY PANEL:   CBC Recent Labs  Lab 03/19/18 0605  WBC 18.8*  HGB 11.6*  HCT 33.7*  PLT 451*   ------------------------------------------------------------------------------------------------------------------  Chemistries  Recent Labs  Lab 03/13/18 0616  03/18/18 1113  NA 139   < > 141  K 4.3   < > 3.6  CL 103   < > 103  CO2 27   < > 32  GLUCOSE 105*   < > 119*  BUN 10   < > 12  CREATININE 0.80   < > 0.66  CALCIUM 9.3   < > 8.5*  AST 37  --   --   ALT 31  --   --   ALKPHOS 96  --   --   BILITOT 0.5  --   --    < > = values in this interval not displayed.   ------------------------------------------------------------------------------------------------------------------  Cardiac Enzymes Recent Labs  Lab 03/13/18 0616  TROPONINI <0.03   ------------------------------------------------------------------------------------------------------------------  RADIOLOGY:  Ct Chest W Contrast  Result Date: 03/19/2018 CLINICAL DATA:  Follow-up pneumonia, right chest tube EXAM: CT CHEST WITH CONTRAST TECHNIQUE: Multidetector CT imaging of the chest was performed during intravenous contrast administration. CONTRAST:  75mL OMNIPAQUE IOHEXOL 300 MG/ML  SOLN COMPARISON:  03/15/2018 FINDINGS: Cardiovascular: Heart is normal in size.  No pericardial effusion. No evidence of thoracic aortic aneurysm. Right arm PICC  terminates at the cavoatrial junction. Mediastinum/Nodes: Mediastinal lymphadenopathy, including a dominant 1.3 cm short axis low right paratracheal node and a 1.4 cm short axis subcarinal node, likely reactive. Visualized thyroid is unremarkable. Lungs/Pleura: Right  middle lobe opacity/consolidation with air bronchograms, compatible with pneumonia. Additional patchy opacity in the right lower lobe and to a lesser extent the right upper lobe. Indwelling pigtail chest tube in the medial right lower hemithorax. Trace enhancing pleural fluid/empyema overlying the right middle lobe (series 3/image 39). Additional small right pleural effusion with enhancing rim posteriorly at the right lung base (series 3/image 106), significantly improved. Minimal patchy opacity in the left upper lobe/lingula and left lung base, likely atelectasis, less likely mild infection. Trace left pleural effusion. No pneumothorax. Upper Abdomen: Visualized upper abdomen is grossly unremarkable. Musculoskeletal: Visualized osseous structures are within normal limits. IMPRESSION: Multifocal right lung pneumonia, right middle lobe predominant. Small right pleural effusion/empyema, decreased, with indwelling pigtail drainage catheter. No pneumothorax. Mild mediastinal lymphadenopathy, likely reactive. Additional ancillary findings as above. Electronically Signed   By: Charline BillsSriyesh  Krishnan M.D.   On: 03/19/2018 11:19   Dg Chest Port 1 View  Result Date: 03/18/2018 CLINICAL DATA:  Right chest tube. EXAM: PORTABLE CHEST 1 VIEW COMPARISON:  03/17/2018. FINDINGS: Right PICC line and right chest tube in stable position. No pneumothorax. Right base atelectasis/infiltrate, improved from prior exam. Mild left base atelectasis. Small bilateral pleural effusions again noted. Heart size stable. IMPRESSION: One right PICC line right chest pneumothorax.Again previously identified loculated right pleural fluid collections have resolved. 2. Right base atelectasis/infiltrate, improved from prior exam. Mild left base atelectasis. Small bilateral pleural effusions again noted. Electronically Signed   By: Maisie Fushomas  Register   On: 03/18/2018 08:06    ASSESSMENT AND PLAN:   Principal Problem:   Community acquired  pneumonia Active Problems:   Pneumonia   Pleural effusion on right  *Sepsis with right-sided pneumonia.  Due to worsening antibiotics have been broadened. Cultures pending  *Right pleural effusion/empyema On IV antibiotics.  110 mL over the last 24 hours from chest tube.  Discussed with Dr. [name].  Repeat CT scan of the chest today. 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  *DVT prophylaxis.  Add Lovenox.  All the records are reviewed and case discussed with Care Management/Social Worker Management plans discussed with the patient, family and they are in agreement.  CODE STATUS: Full.  TOTAL TIME TAKING CARE OF THIS PATIENT: 35 minutes.   POSSIBLE D/C IN 1-2 DAYS, DEPENDING ON CLINICAL CONDITION.   Orie FishermanSrikar R Deloise Marchant M.D on 03/19/2018   Between 7am to 6pm - Pager - [phone]  After 6pm go to [email or web address] - password [email or web address]  [facility] [place] [facility]  Office  [phone]  CC: Primary care physician; Danella PentonMiller, Mark F, MD  Note: This dictation was prepared with Dragon dictation along with smaller phrase technology. Any transcriptional errors that result from this process are unintentional.

## 2018-03-19 NOTE — Progress Notes (Signed)
  Patient ID: Rosalee KaufmanSharon S Bearden, female   DOB: [birth year], 67 y.o.   MRN: [id number]  HISTORY: Overall she appears about the same.  Her health care provider states that she had a reasonably quiet night.  There are no new complaints.  Her chest tube drained approximately 110 cc.  Cultures remain negative to date.   Vitals:   03/18/18 2050 03/19/18 0415  BP: 114/69 131/73  Pulse: 98 (!) 106  Resp: (!) 22 (!) 22  Temp: 98.8 F (37.1 C) 98.3 F (36.8 C)  SpO2: 96% 95%     EXAM:    Resp: Lungs are clear on the left and perhaps slightly diminished with some rhonchi on the right..  No respiratory distress, normal effort. Heart:  Regular without murmurs Abd:  Abdomen is soft, non distended and non tender. No masses are palpable.  There is no rebound and no guarding.   ASSESSMENT: There is no air leak from the tube.  There is minimal drainage.  The fluid appears more serous than purulent.   PLAN:   I think a CT scan of the chest would be helpful to ascertain whether or not there is any additional fluid and whether or not there is a need for TPA.  I will order her CT of the chest with contrast.  The family and the patient are aware.    Hulda Marinimothy Vihana Kydd, MDPatient ID: Rosalee KaufmanSharon S Hommes, female   DOB: [birth year], 67 y.o.   MRN: [id number]

## 2018-03-19 NOTE — Plan of Care (Signed)
IV antibiotics provided. Chest tube changed from wall suction to water seal suction per MD order. No falls during this shift. The patient uses the bedside commode one person assisting them. Chest tube output for the current shift has been 80ml at this time. Bed low, bed wheels locked, bedside rails up x3 and call bell at reach.  Problem: Education: Goal: Knowledge of General Education information will improve Description Including pain rating scale, medication(s)/side effects and non-pharmacologic comfort measures Outcome: Progressing   Problem: Health Behavior/Discharge Planning: Goal: Ability to manage health-related needs will improve Outcome: Progressing   Problem: Clinical Measurements: Goal: Ability to maintain clinical measurements within normal limits will improve Outcome: Progressing Goal: Will remain free from infection Outcome: Progressing Goal: Diagnostic test results will improve Outcome: Progressing Goal: Respiratory complications will improve Outcome: Progressing Goal: Cardiovascular complication will be avoided Outcome: Progressing   Problem: Activity: Goal: Risk for activity intolerance will decrease Outcome: Progressing   Problem: Nutrition: Goal: Adequate nutrition will be maintained Outcome: Progressing   Problem: Coping: Goal: Level of anxiety will decrease Outcome: Progressing   Problem: Elimination: Goal: Will not experience complications related to bowel motility Outcome: Progressing Goal: Will not experience complications related to urinary retention Outcome: Progressing   Problem: Pain Managment: Goal: General experience of comfort will improve Outcome: Progressing   Problem: Safety: Goal: Ability to remain free from injury will improve Outcome: Progressing   Problem: Skin Integrity: Goal: Risk for impaired skin integrity will decrease Outcome: Progressing

## 2018-03-20 LAB — BODY FLUID CULTURE
Culture: NO GROWTH
Gram Stain: NONE SEEN
SPECIAL REQUESTS: NORMAL

## 2018-03-20 LAB — CREATININE, SERUM
CREATININE: 0.91 mg/dL (ref 0.44–1.00)
GFR calc Af Amer: >60 mL/min (ref >60)

## 2018-03-20 LAB — CBC WITH DIFFERENTIAL/PLATELET
BASOS ABS: 0.1 10*3/uL (ref 0–0.1)
BASOS PCT: 1 %
EOS PCT: 2 %
Eosinophils Absolute: 0.2 10*3/uL (ref 0–0.7)
HCT: 32.9 % — ABNORMAL LOW (ref 35.0–47.0)
HEMOGLOBIN: 11.3 g/dL — AB (ref 12.0–16.0)
LYMPHS ABS: 1.9 10*3/uL (ref 1.0–3.6)
Lymphocytes Relative: 12 %
MCH: 32.3 pg (ref 26.0–34.0)
MCHC: 34.4 g/dL (ref 32.0–36.0)
MCV: 93.8 fL (ref 80.0–100.0)
Monocytes Absolute: 1.6 10*3/uL — ABNORMAL HIGH (ref 0.2–0.9)
Monocytes Relative: 10 %
NEUTROS PCT: 77 %
Neutro Abs: 12.5 10*3/uL — ABNORMAL HIGH (ref 1.4–6.5)
PLATELETS: 483 10*3/uL — AB (ref 150–440)
RBC: 3.51 MIL/uL — AB (ref 3.80–5.20)
RDW: 13.4 % (ref 11.5–14.5)
WBC: 16.3 10*3/uL — AB (ref 3.6–11.0)

## 2018-03-20 LAB — VANCOMYCIN, TROUGH: VANCOMYCIN TR: 22 ug/mL — AB (ref 15–20)

## 2018-03-20 MED ORDER — VANCOMYCIN HCL IN DEXTROSE 1-5 GM/200ML-% IV SOLN
1000.0000 mg | Freq: Two times a day (BID) | INTRAVENOUS | Status: DC
  Administered 2018-03-20 – 2018-03-21 (×2): 1000 mg via INTRAVENOUS
  Filled 2018-03-20 (×3): qty 200

## 2018-03-20 MED ORDER — ALTEPLASE 2 MG IJ SOLR
2.0000 mg | Freq: Once | INTRAMUSCULAR | Status: AC
  Administered 2018-03-20: 2 mg
  Filled 2018-03-20 (×2): qty 2

## 2018-03-20 NOTE — Progress Notes (Signed)
Pharmacy Antibiotic Note  Brittany KaufmanSharon S [name] is a 67 y.o. female admitted on 03/13/2018 with IAI.  Pharmacy has been consulted for Zosyn dosing. 03/15/18 due to worsening effusion and continued consolidation in RLL and RML (unchanged pneumonia) pharmacy has also been consulted to dose vancomycin.  Plan:   Vancomycin trough resulted 22 mcg/ml. Doses look like they were timed about appropriately. Will decrease dose to 1g q 12hr. Will wait 4 hr from lab and then give next dose. Level should be below 20 by then. I will check a scr today since pt got a CT w/ contrast yesterday. Pharmacy will continue to follow and adjust as needed to maintain trough 15 to 20 mcg/ml. Check a trough prior to the 4th new dose. Continue cefepime 2g q 8 hr.   Height: 5\' 4"  (162.6 cm) Weight: 143 lb 4.8 oz (65 kg) IBW/kg (Calculated) : 54.7  No data recorded.  Recent Labs  Lab 03/14/18 0531 03/15/18 0447 03/16/18 0941 03/17/18 0555 03/18/18 1113 03/19/18 0605 03/20/18 0537 03/20/18 1351  WBC 31.1* 36.1* 31.7* 22.7* 21.9* 18.8* 16.3*  --   CREATININE 0.69 0.62  --  0.61 0.66  --   --   --   VANCOTROUGH  --   --   --   --  5*  --   --  22*    Estimated Creatinine Clearance: 61.3 mL/min (by C-G formula based on SCr of 0.66 mg/dL).    Allergies  Allergen Reactions  . Sulfa Antibiotics Nausea Only and Rash    Antimicrobials this admission: 8/18 Received vanc/cefepime x 1 in ED 8/18 ceftriaxone and azithromycin were given on admission 8/19 abx changed to Zosyn due to rising WBC - cover for aspiration 8/20 addvancomycin due to worsening CT chest with progressing effusions and continued PNA  Dose adjustments this admission:  Microbiology results: 8/18 BCx: NGTD  UCx:    Sputum:   8/18 MRSA PCR: negative  Thank you for allowing pharmacy to be a part of this patient's care.  Olene FlossMelissa D Makynzi Eastland, Pharm.D., BCPS Clinical Pharmacist 03/20/2018 2:43 PM

## 2018-03-20 NOTE — Progress Notes (Signed)
[employer] - [place] at [facility]   PATIENT NAME: Brittany [name]    MR#:  [id number]  DATE OF BIRTH:  [birth year]  SUBJECTIVE:  CHIEF COMPLAINT:   Chief Complaint  Patient presents with  . Abdominal Pain   Minimal fluid output from chest tube. Continues to have right-sided chest pain  REVIEW OF SYSTEMS:  CONSTITUTIONAL: No fever, fatigue or weakness.  EYES: No blurred or double vision.  EARS, NOSE, AND THROAT: No tinnitus or ear pain.  RESPIRATORY: have cough, shortness of breath,no wheezing or hemoptysis.  CARDIOVASCULAR: Positive chest pain, orthopnea, edema.  GASTROINTESTINAL: No nausea, vomiting, diarrhea or abdominal pain.  GENITOURINARY: No dysuria, hematuria.  ENDOCRINE: No polyuria, nocturia,  HEMATOLOGY: No anemia, easy bruising or bleeding SKIN: No rash or lesion. MUSCULOSKELETAL: No joint pain or arthritis.   NEUROLOGIC: No tingling, numbness, weakness.  PSYCHIATRY: No anxiety or depression.   ROS  DRUG ALLERGIES:   Allergies  Allergen Reactions  . Sulfa Antibiotics Nausea Only and Rash    VITALS:  Blood pressure 131/65, pulse 95, temperature 97.8 [name] (36.6 C), temperature source Oral, resp. rate 20, height 5\' 4"  (1.626 m), weight 65 kg, SpO2 99 %.  PHYSICAL EXAMINATION:   GENERAL:  67 y.o.-year-old patient lying in the bed with no acute distress.  EYES: Pupils equal, round, reactive to light and accommodation. No scleral icterus. Extraocular muscles intact.  HEENT: Head atraumatic, normocephalic. Oropharynx and nasopharynx clear.  Speech is altered NECK:  Supple, no jugular venous distention. No thyroid enlargement, no tenderness.  LUNGS: equal breath sounds both side, no wheezing, some crepitation. No use of accessory muscles of respiration.  Chest tube in place CARDIOVASCULAR: S1, S2 normal. No murmurs, rubs, or gallops.  ABDOMEN: Soft, nontender, nondistended. Bowel sounds present. No organomegaly or mass.  EXTREMITIES: No pedal  edema, cyanosis, or clubbing.  NEUROLOGIC: Cranial nerves II through XII are intact. Muscle strength 5/5 in all extremities. Sensation intact. Gait not checked.  Her speech is slurred PSYCHIATRIC: The patient is alert and awake SKIN: No obvious rash, lesion, or ulcer.   Physical Exam LABORATORY PANEL:   CBC Recent Labs  Lab 03/20/18 0537  WBC 16.3*  HGB 11.3*  HCT 32.9*  PLT 483*   ------------------------------------------------------------------------------------------------------------------  Chemistries  Recent Labs  Lab 03/18/18 1113  NA 141  K 3.6  CL 103  CO2 32  GLUCOSE 119*  BUN 12  CREATININE 0.66  CALCIUM 8.5*   ------------------------------------------------------------------------------------------------------------------  Cardiac Enzymes No results for input(s): TROPONINI in the last 168 hours. ------------------------------------------------------------------------------------------------------------------  RADIOLOGY:  Ct Chest W Contrast  Result Date: 03/19/2018 CLINICAL DATA:  Follow-up pneumonia, right chest tube EXAM: CT CHEST WITH CONTRAST TECHNIQUE: Multidetector CT imaging of the chest was performed during intravenous contrast administration. CONTRAST:  75mL OMNIPAQUE IOHEXOL 300 MG/ML  SOLN COMPARISON:  03/15/2018 FINDINGS: Cardiovascular: Heart is normal in size.  No pericardial effusion. No evidence of thoracic aortic aneurysm. Right arm PICC terminates at the cavoatrial junction. Mediastinum/Nodes: Mediastinal lymphadenopathy, including a dominant 1.3 cm short axis low right paratracheal node and a 1.4 cm short axis subcarinal node, likely reactive. Visualized thyroid is unremarkable. Lungs/Pleura: Right middle lobe opacity/consolidation with air bronchograms, compatible with pneumonia. Additional patchy opacity in the right lower lobe and to a lesser extent the right upper lobe. Indwelling pigtail chest tube in the medial right lower hemithorax.  Trace enhancing pleural fluid/empyema overlying the right middle lobe (series 3/image 39). Additional small right pleural effusion with enhancing rim posteriorly at the  right lung base (series 3/image 106), significantly improved. Minimal patchy opacity in the left upper lobe/lingula and left lung base, likely atelectasis, less likely mild infection. Trace left pleural effusion. No pneumothorax. Upper Abdomen: Visualized upper abdomen is grossly unremarkable. Musculoskeletal: Visualized osseous structures are within normal limits. IMPRESSION: Multifocal right lung pneumonia, right middle lobe predominant. Small right pleural effusion/empyema, decreased, with indwelling pigtail drainage catheter. No pneumothorax. Mild mediastinal lymphadenopathy, likely reactive. Additional ancillary findings as above. Electronically Signed   By: Charline BillsSriyesh  Krishnan M.D.   On: 03/19/2018 11:19    ASSESSMENT AND PLAN:   Principal Problem:   Community acquired pneumonia Active Problems:   Pneumonia   Pleural effusion on right  *Sepsis with right-sided pneumonia.  Due to worsening antibiotics have been broadened. Cultures pending  *Right pleural effusion/empyema On IV antibiotics.   Discussed with Dr. [name].  Chest tube to be removed tomorrow  *Dysphagia Due to traumatic brain injury, continue her diet as per preadmission.  *DVT prophylaxis.   Lovenox.  [facility]  Does not want to return to that place.  Her healthcare power of attorney is looking at other facilities.  All the records are reviewed and case discussed with Care Management/Social Worker Management plans discussed with the patient, family and they are in agreement.  CODE STATUS: Full.  TOTAL TIME TAKING CARE OF THIS PATIENT: 35 minutes.   POSSIBLE D/C IN 1-2 DAYS, DEPENDING ON CLINICAL CONDITION.   Orie FishermanSrikar R Akima Slaugh M.D on 03/20/2018   Between 7am to 6pm - Pager - [phone]  After 6pm go to [email or web address] -  password [email or web address]  [facility]  Office  [phone]  CC: Primary care physician; Brittany [name], Brittany F, MD  Note: This dictation was prepared with Dragon dictation along with smaller phrase technology. Any transcriptional errors that result from this process are unintentional.

## 2018-03-21 LAB — CBC WITH DIFFERENTIAL/PLATELET
Basophils Absolute: 0.1 10*3/uL (ref 0–0.1)
Basophils Relative: 1 %
Eosinophils Absolute: 0.3 10*3/uL (ref 0–0.7)
Eosinophils Relative: 2 %
HEMATOCRIT: 33.8 % — AB (ref 35.0–47.0)
HEMOGLOBIN: 11.5 g/dL — AB (ref 12.0–16.0)
LYMPHS ABS: 2.5 10*3/uL (ref 1.0–3.6)
LYMPHS PCT: 15 %
MCH: 31.7 pg (ref 26.0–34.0)
MCHC: 34 g/dL (ref 32.0–36.0)
MCV: 93.3 fL (ref 80.0–100.0)
MONOS PCT: 10 %
Monocytes Absolute: 1.6 10*3/uL — ABNORMAL HIGH (ref 0.2–0.9)
NEUTROS ABS: 12.5 10*3/uL — AB (ref 1.4–6.5)
Neutrophils Relative %: 74 %
Platelets: 545 10*3/uL — ABNORMAL HIGH (ref 150–440)
RBC: 3.62 MIL/uL — ABNORMAL LOW (ref 3.80–5.20)
RDW: 13.3 % (ref 11.5–14.5)
WBC: 16.9 10*3/uL — ABNORMAL HIGH (ref 3.6–11.0)

## 2018-03-21 MED ORDER — AMOXICILLIN-POT CLAVULANATE 875-125 MG PO TABS
1.0000 | ORAL_TABLET | Freq: Two times a day (BID) | ORAL | Status: DC
  Administered 2018-03-21 – 2018-03-23 (×5): 1 via ORAL
  Filled 2018-03-21 (×5): qty 1

## 2018-03-21 MED ORDER — ADULT MULTIVITAMIN W/MINERALS CH
1.0000 | ORAL_TABLET | Freq: Every day | ORAL | Status: DC
  Administered 2018-03-21 – 2018-03-23 (×3): 1 via ORAL
  Filled 2018-03-21 (×3): qty 1

## 2018-03-21 MED ORDER — NEPRO/CARBSTEADY PO LIQD
237.0000 mL | Freq: Two times a day (BID) | ORAL | Status: DC
  Administered 2018-03-21 – 2018-03-23 (×3): 237 mL via ORAL

## 2018-03-21 NOTE — Progress Notes (Signed)
Pt refused bed alarm. Pt with personal sitter at bedside. Pt educated on need to prevent falls. Pt verbalizes understanding and states she does not want the alarm. Pt is alert and oriented x3, although difficult to understand, is able to answer the questions appropriately.

## 2018-03-21 NOTE — Progress Notes (Signed)
[employer] - [place] at [facility]   PATIENT NAME: Brittany [name]    MR#:  [id number]  DATE OF BIRTH:  1950-11-27  SUBJECTIVE:  CHIEF COMPLAINT:   Chief Complaint  Patient presents with  . Abdominal Pain   Chest tube removed.  Afebrile.  No further right-sided chest pain.  No cough.  REVIEW OF SYSTEMS:  CONSTITUTIONAL: No fever, fatigue or weakness.  EYES: No blurred or double vision.  EARS, NOSE, AND THROAT: No tinnitus or ear pain.  RESPIRATORY: have cough, shortness of breath,no wheezing or hemoptysis.  CARDIOVASCULAR: Positive chest pain, orthopnea, edema.  GASTROINTESTINAL: No nausea, vomiting, diarrhea or abdominal pain.  GENITOURINARY: No dysuria, hematuria.  ENDOCRINE: No polyuria, nocturia,  HEMATOLOGY: No anemia, easy bruising or bleeding SKIN: No rash or lesion. MUSCULOSKELETAL: No joint pain or arthritis.   NEUROLOGIC: No tingling, numbness, weakness.  PSYCHIATRY: No anxiety or depression.   ROS  DRUG ALLERGIES:   Allergies  Allergen Reactions  . Sulfa Antibiotics Nausea Only and Rash    VITALS:  Blood pressure 126/68, pulse 100, temperature 98.4 F (36.9 C), resp. rate 18, height 5\' 4"  (1.626 m), weight 65 kg, SpO2 95 %.  PHYSICAL EXAMINATION:   GENERAL:  67 y.o.-year-old patient lying in the bed with no acute distress.  EYES: Pupils equal, round, reactive to light and accommodation. No scleral icterus. Extraocular muscles intact.  HEENT: Head atraumatic, normocephalic. Oropharynx and nasopharynx clear.  Speech is altered NECK:  Supple, no jugular venous distention. No thyroid enlargement, no tenderness.  LUNGS: equal breath sounds both side, no wheezing, some crepitation. No use of accessory muscles of respiration.  CARDIOVASCULAR: S1, S2 normal. No murmurs, rubs, or gallops.  ABDOMEN: Soft, nontender, nondistended. Bowel sounds present. No organomegaly or mass.  EXTREMITIES: No pedal edema, cyanosis, or clubbing.  NEUROLOGIC:  Cranial nerves II through XII are intact. Muscle strength 5/5 in all extremities. Sensation intact. Gait not checked.  Her speech is slurred PSYCHIATRIC: The patient is alert and awake SKIN: No obvious rash, lesion, or ulcer.   Physical Exam LABORATORY PANEL:   CBC Recent Labs  Lab 03/21/18 0436  WBC 16.9*  HGB 11.5*  HCT 33.8*  PLT 545*   ------------------------------------------------------------------------------------------------------------------  Chemistries  Recent Labs  Lab 03/18/18 1113 03/20/18 0537  NA 141  --   K 3.6  --   CL 103  --   CO2 32  --   GLUCOSE 119*  --   BUN 12  --   CREATININE 0.66 0.91  CALCIUM 8.5*  --    ------------------------------------------------------------------------------------------------------------------  Cardiac Enzymes No results for input(s): TROPONINI in the last 168 hours. ------------------------------------------------------------------------------------------------------------------  RADIOLOGY:  No results found.  ASSESSMENT AND PLAN:   Principal Problem:   Community acquired pneumonia Active Problems:   Pneumonia   Pleural effusion on right  *Sepsis with right-sided pneumonia.  Due to worsening antibiotics have been broadened. Cultures negative Change to oral Augmentin  *Right pleural effusion/empyema On IV antibiotics.  Changed to oral Augmentin today Chest tube removed  *Dysphagia Due to traumatic brain injury, continue her diet as per preadmission.  *DVT prophylaxis.   Lovenox.  Likely discharge tomorrow if WBC trending down  All the records are reviewed and case discussed with Care Management/Social Worker Management plans discussed with the patient, family and they are in agreement.  CODE STATUS: Full.  TOTAL TIME TAKING CARE OF THIS PATIENT: 35 minutes.   POSSIBLE D/C IN 1-2 DAYS, DEPENDING ON CLINICAL CONDITION.   Treshun Wold  [place] Joffre Lucks M.D on 03/21/2018   Between 7am to 6pm - Pager -  [phone]  After 6pm go to [email or web address] - password [email or web address]  [facility] [place] [facility]  Office  [phone]  CC: Primary care physician; Danella PentonMiller, Mark F, MD  Note: This dictation was prepared with Dragon dictation along with smaller phrase technology. Any transcriptional errors that result from this process are unintentional.

## 2018-03-21 NOTE — Plan of Care (Signed)
Planning for discharge in a.m. Callbell within reach,caregiver at  bedside
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
## 2018-03-21 NOTE — Progress Notes (Signed)
  Patient ID: Brittany [name], female   DOB: [birth year], 67 y.o.   MRN: [id number]  HISTORY: She did well overnight.  She has no new complaints.  She remains afebrile.   Vitals:   03/21/18 0424 03/21/18 1048  BP: 121/63 126/68  Pulse: 94 100  Resp: (!) 22 18  Temp:  98.4 F (36.9 C)  SpO2: 94% 95%     EXAM:    Resp: Lungs are clear bilaterally.  No respiratory distress, normal effort. Heart:  Regular without murmurs Abd:  Abdomen is soft, non distended and non tender. No masses are palpable.  There is no rebound and no guarding.   Independent review of her chest CT shows almost all the fluid is now evacuated with the exception of one small portion immediately posterior to the liver.  The pneumonia also appears slightly improved.  ASSESSMENT: There was no air leak from the chest tube and because it has not drained anything I removed it today.   PLAN:   I would continue her oral antibiotics as you are planning.  I do not see any need for any surgical intervention at this time.    Hulda Marinimothy Bohden Dung, MDPatient ID: Brittany KaufmanSharon S [name], female   DOB: [birth year], 67 y.o.   MRN: [id number]

## 2018-03-21 NOTE — Care Management Important Message (Signed)
Important Message  Patient Details  Name: Rosalee KaufmanSharon S Karrer MRN: [id number] Date of Birth: 15-Sep-1950   Medicare Important Message Given:  Yes    Olegario MessierKathy A Maeleigh Buschman 03/21/2018, 1:31 PM

## 2018-03-21 NOTE — Progress Notes (Signed)
Pt tol chest tube removal well. Provided with cold nectar thick liquids per her request. Denies further needs at this time. NAD

## 2018-03-21 NOTE — Progress Notes (Signed)
Initial Nutrition Assessment  DOCUMENTATION CODES:   Not applicable  INTERVENTION:   Nepro Shake po BID, each supplement provides 425 kcal and 19 grams protein  MVI daily  Magic cup TID with meals, each supplement provides 290 kcal and 9 grams of protein  Chopped meats with meals   Recommend SLP evaluation   NUTRITION DIAGNOSIS:   Increased nutrient needs related to acute illness(CAP with sepsis and empyema) as evidenced by increased estimated needs.  GOAL:   Patient will meet greater than or equal to 90% of their needs  MONITOR:   PO intake, Supplement acceptance, Labs, Weight trends, Skin, I & O's  REASON FOR ASSESSMENT:   LOS    ASSESSMENT:   67 y.o. female with a known history of traumatic brain injury and speech impairment, h/o aspiration PNA, lives in a group home and on nectar thick liquids admitted with CAP, sepsis and empyema now s/p IR chest tube placement    Visited pt's room today. Pt is a limited historian r/t h/o TBI but reports good appetite and oral intake at baseline. Per H & P, pt noted to be on a dysphagia 1/nectar thick diet at the group home where she resides but pt's caregiver at bedside reports that pt eats a regular diet with nectar thick fluids at baseline. Pt ate a hotdog for lunch today. Pt eating 50-100% of meals. Pt does have a history of multiple admits for aspiration PNA. Recommend SLP evaluation. There is no weight history in chart to determine if any significant recent weight loss. Pt does not appear malnourished. RD will add supplements and vitamins to help pt meet her estimated needs. Chest tube removed today.    Medications reviewed and include: Augmentin, lovenox, KCl  Labs reviewed: wbc- 16.9(H)  NUTRITION - FOCUSED PHYSICAL EXAM:    Most Recent Value  Orbital Region  No depletion  Upper Arm Region  No depletion  Thoracic and Lumbar Region  No depletion  Buccal Region  No depletion  Temple Region  No depletion  Clavicle Bone  Region  No depletion  Clavicle and Acromion Bone Region  No depletion  Scapular Bone Region  No depletion  Dorsal Hand  No depletion  Patellar Region  Moderate depletion  Anterior Thigh Region  Moderate depletion  Posterior Calf Region  Moderate depletion  Edema (RD Assessment)  None  Hair  Reviewed  Eyes  Reviewed  Mouth  Reviewed  Skin  Reviewed  Nails  Reviewed     Diet Order:   Diet Order            Diet regular Room service appropriate? Yes; Fluid consistency: Nectar Thick  Diet effective now             EDUCATION NEEDS:   No education needs have been identified at this time  Skin:  Skin Assessment: Reviewed RN Assessment  Last BM:  8/25-TYPE 4  Height:   Ht Readings from Last 1 Encounters:  03/13/18 5\' 4"  (1.626 m)    Weight:   Wt Readings from Last 1 Encounters:  03/13/18 65 kg    Ideal Body Weight:  54.5 kg  BMI:  Body mass index is 24.6 kg/m.  Estimated Nutritional Needs:   Kcal:  1400-1600kcal/day  Protein:  72-84g/day   Fluid:  >1.4L/day   Brittany Holidayasey Zaiah Eckerson MS, RD, LDN Pager #- [phone] Office#- [phone] After Hours Pager: [phone]

## 2018-03-21 NOTE — Clinical Social Work Note (Signed)
CSW spoke with MD today and patient will need to discharge back to her independent apartment at [facility]. PT has stated patient does not have any rehab needs. HCPOA is looking into new living arrangements but will need to continue doing this as an outpatient. York SpanielMonica Benedicto Capozzi MSW,LCSW [phone]

## 2018-03-21 NOTE — Discharge Instructions (Signed)
Resume diet and activity as before ° ° °

## 2018-03-22 DIAGNOSIS — Z9081 Acquired absence of spleen: Secondary | ICD-10-CM

## 2018-03-22 DIAGNOSIS — J181 Lobar pneumonia, unspecified organism: Secondary | ICD-10-CM

## 2018-03-22 DIAGNOSIS — J869 Pyothorax without fistula: Secondary | ICD-10-CM

## 2018-03-22 DIAGNOSIS — G248 Other dystonia: Secondary | ICD-10-CM

## 2018-03-22 DIAGNOSIS — R479 Unspecified speech disturbances: Secondary | ICD-10-CM

## 2018-03-22 DIAGNOSIS — Z87820 Personal history of traumatic brain injury: Secondary | ICD-10-CM

## 2018-03-22 DIAGNOSIS — Z993 Dependence on wheelchair: Secondary | ICD-10-CM

## 2018-03-22 LAB — CBC WITH DIFFERENTIAL/PLATELET
BASOS ABS: 0.1 10*3/uL (ref 0–0.1)
Basophils Relative: 1 %
EOS PCT: 2 %
Eosinophils Absolute: 0.3 10*3/uL (ref 0–0.7)
HEMATOCRIT: 32.8 % — AB (ref 35.0–47.0)
Hemoglobin: 11.9 g/dL — ABNORMAL LOW (ref 12.0–16.0)
Lymphocytes Relative: 15 %
Lymphs Abs: 2.6 10*3/uL (ref 1.0–3.6)
MCH: 33.8 pg (ref 26.0–34.0)
MCHC: 36.2 g/dL — AB (ref 32.0–36.0)
MCV: 93.4 fL (ref 80.0–100.0)
MONO ABS: 1.6 10*3/uL — AB (ref 0.2–0.9)
MONOS PCT: 9 %
Neutro Abs: 12.8 10*3/uL — ABNORMAL HIGH (ref 1.4–6.5)
Neutrophils Relative %: 73 %
PLATELETS: 541 10*3/uL — AB (ref 150–440)
RBC: 3.51 MIL/uL — ABNORMAL LOW (ref 3.80–5.20)
RDW: 13.7 % (ref 11.5–14.5)
WBC: 17.4 10*3/uL — ABNORMAL HIGH (ref 3.6–11.0)

## 2018-03-22 NOTE — Progress Notes (Signed)
[facility] [place] [facility]   PATIENT NAME: Brittany [name]    MR#:  [id number]  DATE OF BIRTH:  05-25-1951  SUBJECTIVE:  CHIEF COMPLAINT:   Chief Complaint  Patient presents with  . Abdominal Pain   Patient continues to complain of mild upper right chest pain.  Poor historian. Mother and her healthcare power of attorney(Mark) are at bedside.  REVIEW OF SYSTEMS:  CONSTITUTIONAL: No fever, fatigue or weakness.  EYES: No blurred or double vision.  EARS, NOSE, AND THROAT: No tinnitus or ear pain.  RESPIRATORY: have cough, shortness of breath,no wheezing or hemoptysis.  CARDIOVASCULAR: Positive chest pain, orthopnea, edema.  GASTROINTESTINAL: No nausea, vomiting, diarrhea or abdominal pain.  GENITOURINARY: No dysuria, hematuria.  ENDOCRINE: No polyuria, nocturia,  HEMATOLOGY: No anemia, easy bruising or bleeding SKIN: No rash or lesion. MUSCULOSKELETAL: No joint pain or arthritis.   NEUROLOGIC: No tingling, numbness, weakness.  PSYCHIATRY: No anxiety or depression.   ROS  DRUG ALLERGIES:   Allergies  Allergen Reactions  . Sulfa Antibiotics Nausea Only and Rash    VITALS:  Blood pressure 138/74, pulse (!) 110, temperature 98.2 F (36.8 C), temperature source Oral, resp. rate 20, height 5\' 4"  (1.626 m), weight 65 kg, SpO2 96 %.  PHYSICAL EXAMINATION:   GENERAL:  67 y.o.-year-old patient lying in the bed with no acute distress.  EYES: Pupils equal, round, reactive to light and accommodation. No scleral icterus. Extraocular muscles intact.  HEENT: Head atraumatic, normocephalic. Oropharynx and nasopharynx clear.  Speech is altered NECK:  Supple, no jugular venous distention. No thyroid enlargement, no tenderness.  LUNGS: equal breath sounds both side, no wheezing, some crepitation. No use of accessory muscles of respiration.  CARDIOVASCULAR: S1, S2 normal. No murmurs, rubs, or gallops.  ABDOMEN: Soft, nontender, nondistended. Bowel sounds present. No  organomegaly or mass.  EXTREMITIES: No pedal edema, cyanosis, or clubbing.  NEUROLOGIC: Cranial nerves II through XII are intact. Muscle strength 5/5 in all extremities. Sensation intact. Gait not checked.  Her speech is slurred PSYCHIATRIC: The patient is alert and awake SKIN: No obvious rash, lesion, or ulcer.   Physical Exam LABORATORY PANEL:   CBC Recent Labs  Lab 03/22/18 0630  WBC 17.4*  HGB 11.9*  HCT 32.8*  PLT 541*   ------------------------------------------------------------------------------------------------------------------  Chemistries  Recent Labs  Lab 03/18/18 1113 03/20/18 0537  NA 141  --   K 3.6  --   CL 103  --   CO2 32  --   GLUCOSE 119*  --   BUN 12  --   CREATININE 0.66 0.91  CALCIUM 8.5*  --    ------------------------------------------------------------------------------------------------------------------  Cardiac Enzymes No results for input(s): TROPONINI in the last 168 hours. ------------------------------------------------------------------------------------------------------------------  RADIOLOGY:  No results found.  ASSESSMENT AND PLAN:   Principal Problem:   Community acquired pneumonia Active Problems:   Pneumonia   Pleural effusion on right  *Sepsis with right-sided pneumonia.  Due to worsening antibiotics have been broadened. Cultures negative Changed to oral Augmentin Minimal increase in WBC.  Unclear if this is lab variation or true worsening.  We will continue Augmentin.  Repeat WBC in a.m.  Will be discharged home if WBC trending down.  Will need repeat CT scan if WBC continues to trend up. Discussed with Dr. Joylene Draftavisankar  *Right pleural effusion/empyema On IV antibiotics.  Changed to oral Augmentin Chest tube removed  *Dysphagia Due to traumatic brain injury, continue her diet as per preadmission.  *DVT prophylaxis.   Lovenox.  discharge tomorrow if WBC trending down  All the records are reviewed and case  discussed with Care Management/Social Worker Management plans discussed with the patient, family and they are in agreement.  CODE STATUS: Full.  TOTAL TIME TAKING CARE OF THIS PATIENT: 35 minutes.   POSSIBLE D/C IN 1-2 DAYS, DEPENDING ON CLINICAL CONDITION.  Orie Fisherman M.D on 03/22/2018   Between 7am to 6pm - Pager - [phone]  After 6pm go to [email or web address] - password [email or web address]  [facility]  Office  [phone]  CC: Primary care physician; Danella Penton, MD  Note: This dictation was prepared with Dragon dictation along with smaller phrase technology. Any transcriptional errors that result from this process are unintentional.

## 2018-03-22 NOTE — Progress Notes (Signed)
  ID: Brittany [name] is a 67 y.o. female     Subjective: Says she is not feeling best No fever No cough Was switched to augmentin yesterday by the primary team t- slight increase in WBC Medications:  . amoxicillin-clavulanate  1 tablet Oral Q12H  . diazepam  5 mg Oral QHS  . enoxaparin (LOVENOX) injection  40 mg Subcutaneous Daily  . feeding supplement (NEPRO CARB STEADY)  237 mL Oral BID BM  . multivitamin with minerals  1 tablet Oral Daily  . potassium chloride  20 mEq Oral BID  . 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 Sedimentation Rate No results for input(s): ESRSEDRATE in the last 72 hours. C-Reactive Protein No results for input(s): CRP in the last 72 hours.  Micro[facility]22 Pleural fluid- exudate  Studies/Results: Dg Chest Port 1 View  Result Date: 03/18/2018 CLINICAL DATA:  Right chest tube. EXAM: PORTABLE CHEST 1 VIEW COMPARISON:  03/17/2018. FINDINGS: Right PICC line and right chest tube in stable position. No pneumothorax. Right base atelectasis/infiltrate, improved from prior exam. Mild left base atelectasis. Small bilateral pleural effusions again  noted. Heart size stable. IMPRESSION: One right PICC line right chest pneumothorax.Again previously identified loculated right pleural fluid collections have resolved. 2. Right base atelectasis/infiltrate, improved from prior exam. Mild left base atelectasis. Small bilateral pleural effusions again noted. Electronically Signed   By: Maisie Fushomas  Register   On: 03/18/2018 08:06    Assessment/Plan: 67 y.o. female with a history of traumatic brain injury,speech impediment, splenectomy  wheel chair bound in  A group home was admitted to the hospital with worsening pneumonia even after starting on levaquin by PCP. PT continued to have worsening cough and rt sided chest pain.  Rt lung pneumonia with parapneumonic effusion/empyema - from independent/assisted living facility- common organisms pneumococcus, sta[h and gram neg.with splenectomy capsulated organisms are a concern- adequately covered  Was on  vanco, zosyn and zithro-and then  vanco+ cefepime + flagyl. Started augmentin yesterday after 9 days of IV antibiotics Wbc increasing- watch closely and if it worsens will need CT chest repeated  Rt chest drain removed  TBI   Discussed the management with her care taker at bed side and with Dr.sudini   Brittany ItoJAYASHREE Denni [name]

## 2018-03-23 LAB — CBC WITH DIFFERENTIAL/PLATELET
Basophils Absolute: 0.1 10*3/uL (ref 0–0.1)
Basophils Relative: 1 %
EOS ABS: 0.2 10*3/uL (ref 0–0.7)
EOS PCT: 1 %
HCT: 34.9 % — ABNORMAL LOW (ref 35.0–47.0)
Hemoglobin: 12 g/dL (ref 12.0–16.0)
LYMPHS ABS: 2.8 10*3/uL (ref 1.0–3.6)
Lymphocytes Relative: 18 %
MCH: 32.2 pg (ref 26.0–34.0)
MCHC: 34.3 g/dL (ref 32.0–36.0)
MCV: 93.8 fL (ref 80.0–100.0)
MONOS PCT: 10 %
Monocytes Absolute: 1.5 10*3/uL — ABNORMAL HIGH (ref 0.2–0.9)
Neutro Abs: 11 10*3/uL — ABNORMAL HIGH (ref 1.4–6.5)
Neutrophils Relative %: 70 %
PLATELETS: 595 10*3/uL — AB (ref 150–440)
RBC: 3.71 MIL/uL — ABNORMAL LOW (ref 3.80–5.20)
RDW: 13.7 % (ref 11.5–14.5)
WBC: 15.7 10*3/uL — ABNORMAL HIGH (ref 3.6–11.0)

## 2018-03-23 MED ORDER — TRAMADOL HCL 50 MG PO TABS: 50.0000 mg | ORAL_TABLET | Freq: Four times a day (QID) | ORAL | 0 refills | Status: AC | PRN

## 2018-03-23 MED ORDER — AMOXICILLIN-POT CLAVULANATE 875-125 MG PO TABS: 1.0000 | ORAL_TABLET | Freq: Two times a day (BID) | ORAL | 0 refills | Status: DC

## 2018-03-23 NOTE — Care Management (Signed)
It is noted that patient has no rehabilitation needs and that plan is for transition back to place of living. Per note patient is no longer requiring supplemental O2.  [facility] with any concerns  [phone].

## 2018-03-23 NOTE — Care Management Important Message (Signed)
Copy of IM left with patient in room. 

## 2018-03-29 ENCOUNTER — Ambulatory Visit (INDEPENDENT_AMBULATORY_CARE_PROVIDER_SITE_OTHER): Payer: Medicare Other | Admitting: Internal Medicine

## 2018-03-29 ENCOUNTER — Encounter: Payer: Self-pay | Admitting: Internal Medicine

## 2018-03-29 VITALS — BP 130/62 | HR 98 | Resp 16 | Ht 64.0 in | Wt 120.0 lb

## 2018-03-29 DIAGNOSIS — J90 Pleural effusion, not elsewhere classified: Secondary | ICD-10-CM

## 2018-03-29 DIAGNOSIS — S069X9S Unspecified intracranial injury with loss of consciousness of unspecified duration, sequela: Secondary | ICD-10-CM | POA: Diagnosis not present

## 2018-03-29 DIAGNOSIS — J189 Pneumonia, unspecified organism: Secondary | ICD-10-CM | POA: Diagnosis not present

## 2018-03-29 NOTE — Patient Instructions (Signed)
Pneumonitis  Pneumonitis is inflammation of the lungs. Infection or exposure to certain substances or allergens can cause this condition. Allergens are substances that you are allergic to.  What are the causes?  This condition may be caused by:  · An infection from bacteria or a virus (pneumonia).  · Exposure to certain substances in the workplace. This includes working on farms and in certain industries. Some substances that can cause this condition include asbestos, silica, inhaled acids, or inhaled chlorine gas.  · Repeated exposure to bird feathers, bird feces, or other allergens.  · Medicines such as chemotherapy drugs, certain antibiotic medicines, and some heart medicines.  · Radiation therapy.  · Exposure to mold. A hot tub, sauna, or home humidifier can have mold growing in it, even if it looks clean. You can breathe in the mold through water vapor.  · Breathing in (aspirating) stomach contents, food, or liquids into the lungs.    What are the signs or symptoms?  Symptoms of this condition include:  · Shortness of breath or trouble breathing. This is the most common symptom.  · Cough.  · Fever.  · Decreased energy.  · Decreased appetite.    How is this diagnosed?  This condition may be diagnosed based on:  · Your medical history.  · Physical exam.  · Blood tests.  · Other tests, including:  ? Pulmonary function test (PFT). This measures how well your lungs work.  ? Chest X-ray.  ? CT scan of the lungs.  ? Bronchoscopy. In this procedure, your health care provider looks at your airways through an instrument called a bronchoscope.  ? Lung biopsy. In this procedure, your health care provider takes a small piece of tissue from your lungs to examine it.    How is this treated?  Treatment depends on the cause of the condition. If the cause is exposure to a substance, avoiding further exposure to that substance will help reduce your symptoms. Possible medical treatments for pneumonitis include:  · Corticosteroid  medicine to help decrease inflammation.  · Antibiotic medicine to help fight an infection caused by bacteria.  · Bronchodilators or inhalers to help relax the muscles and make breathing easier.  · Oxygen therapy, if you are having trouble breathing.    Follow these instructions at home:  · Take or use over-the-counter and prescription medicines only as told by your health care provider. This includes any inhaler use.  · Avoid exposure to any substance that caused your pneumonitis. If you need to work with substances that can cause pneumonitis, wear a mask to protect your lungs.  · If you were prescribed an antibiotic, take it as told by your health care provider. Do not stop taking the antibiotic even if you start to feel better.  · If you were prescribed an inhaler, keep it with you at all times.  · Do not use any products that contain nicotine or tobacco, such as cigarettes and e-cigarettes. If you need help quitting, ask your health care provider.  · Keep all follow-up visits as told by your health care provider. This is important.  Contact a health care provider if:  · You have a fever.  · Your symptoms get worse.  Get help right away if:  · You have new or worse shortness of breath.  · You develop a blue color (cyanosis) under your fingernails.  Summary  · Pneumonitis is inflammation of the lungs. This condition can be caused by infection or exposure   to certain substances or allergens.  · The most common symptom of this condition is shortness of breath or trouble breathing.  · Treatment depends on the cause of your condition.  This information is not intended to replace advice given to you by your health care provider. Make sure you discuss any questions you have with your health care provider.  Document Released: 12/31/2009 Document Revised: 06/04/2016 Document Reviewed: 06/04/2016  Elsevier Interactive Patient Education © 2017 [employer]

## 2018-03-29 NOTE — Progress Notes (Signed)
[facility] [place], [place] [address]  Pulmonary Sleep Medicine   Office Visit Note  Patient Name: Brittany [name] DOB: 01-12-1951 MRN [id number]  Date of Service: 03/29/2018  Complaints/HPI:   Patient is here for follow-up after hospital admission.  She was seen in the hospital basically with any lobar pneumonia involving the right side and in addition she was found to have a pleural effusion.  The pleural effusion required drainage by a chest tube.  Thoracic surgery placed a chest tube with good results.  She is now here for follow-up she is still having a little bit of a cough no fevers no chills no shortness of breath.  Denies having any chest pain.  No sputum production is noted.  The last chest x-ray on the 23rd of August had shown improvement in her fluid.  She has not had a followup chest film done since she was discharged and so therefore we will need to reassess fluid.  I he reviewed the x-rays with the caregiver that was present.  The patient herself has a history of traumatic brain injury so therefore is not able to fully comprehend her medical care.  ROS  General: (-) fever, (-) chills, (-) night sweats, (-) weakness Skin: (-) rashes, (-) itching,. Eyes: (-) visual changes, (-) redness, (-) itching. Nose and Sinuses: (-) nasal stuffiness or itchiness, (-) postnasal drip, (-) nosebleeds, (-) sinus trouble. Mouth and Throat: (-) sore throat, (-) hoarseness. Neck: (-) swollen glands, (-) enlarged thyroid, (-) neck pain. Respiratory: - cough, (-) bloody sputum, + shortness of breath, - wheezing. Cardiovascular: - ankle swelling, (-) chest pain. Lymphatic: (-) lymph node enlargement. Neurologic: (-) numbness, (-) tingling. Psychiatric: (-) anxiety, (-) depression   Current Medication: Outpatient Encounter Medications as of 03/29/2018  Medication Sig Note  . amoxicillin-clavulanate (AUGMENTIN) 875-125 MG tablet Take 1 tablet by mouth 2 (two) times  daily.   . diazepam (VALIUM) 10 MG tablet Take 10 mg by mouth at bedtime as needed. 03/13/2018: Last dispensed: 12/08/2017   . pseudoephedrine (SUDAFED) 30 MG tablet Take 60 mg by mouth 2 (two) times daily as needed for congestion.   . traMADol (ULTRAM) 50 MG tablet Take 1 tablet (50 mg total) by mouth every 6 (six) hours as needed for severe pain.    No facility-administered encounter medications on file as of 03/29/2018.     Surgical History: Past Surgical History:  Procedure Laterality Date  . BREAST EXCISIONAL BIOPSY Left 1992   neg  . SPLENECTOMY, TOTAL      Medical History: Past Medical History:  Diagnosis Date  . Speech impairment   . TBI (traumatic brain injury) ([place])     Family History: Family History  Problem Relation Age of Onset  . Hypertension Mother   . Breast cancer Neg Hx     Social History: Social History   Socioeconomic History  . Marital status: Single    Spouse name: Not on file  . Number of children: Not on file  . Years of education: Not on file  . Highest education level: Not on file  Occupational History  . Not on file  Social Needs  . Financial resource strain: Not on file  . Food insecurity:    Worry: Not on file    Inability: Not on file  . Transportation needs:    Medical: Not on file    Non-medical: Not on file  Tobacco Use  . Smoking status: Former Research scientist (life sciences)  . Smokeless  tobacco: Never Used  Substance and Sexual Activity  . Alcohol use: Never    Frequency: Never  . Drug use: Never  . Sexual activity: Yes    Birth control/protection: Post-menopausal  Lifestyle  . Physical activity:    Days per week: Not on file    Minutes per session: Not on file  . Stress: Not on file  Relationships  . Social connections:    Talks on phone: Not on file    Gets together: Not on file    Attends religious service: Not on file    Active member of club or organization: Not on file    Attends meetings of clubs or organizations: Not on file     Relationship status: Not on file  . Intimate partner violence:    Fear of current or ex partner: Not on file    Emotionally abused: Not on file    Physically abused: Not on file    Forced sexual activity: Not on file  Other Topics Concern  . Not on file  Social History Narrative  . Not on file    Vital Signs: Blood pressure 130/62, pulse 98, resp. rate 16, height 5' 4"  (1.626 m), weight 120 lb (54.4 kg), SpO2 95 %.  Examination: General Appearance: The patient is well-developed, well-nourished, and in no distress. Skin: Gross inspection of skin unremarkable. Head: normocephalic, no gross deformities. Eyes: no gross deformities noted. ENT: ears appear grossly normal no exudates. Neck: Supple. No thyromegaly. No LAD. Respiratory: few rhonchi noted. Cardiovascular: Normal S1 and S2 without murmur or rub. Extremities: No cyanosis. pulses are equal. Neurologic: Alert and oriented. No involuntary movements.  LABS: Recent Results (from the past 2160 hour(s))  Lipase, blood     Status: None   Collection Time: 03/13/18  6:16 AM  Result Value Ref Range   Lipase 30 11 - 51 U/L    Comment: Performed at [facility], [address] [place], [place] [address]  Comprehensive metabolic panel     Status: Abnormal   Collection Time: 03/13/18  6:16 AM  Result Value Ref Range   Sodium 139 135 - 145 mmol/L   Potassium 4.3 3.5 - 5.1 mmol/L   Chloride 103 98 - 111 mmol/L   CO2 27 22 - 32 mmol/L   Glucose, Bld 105 (H) 70 - 99 mg/dL   BUN 10 8 - 23 mg/dL   Creatinine, Ser 0.80 0.44 - 1.00 mg/dL   Calcium 9.3 8.9 - 10.3 mg/dL   Total Protein 7.6 6.5 - 8.1 g/dL   Albumin 3.4 (L) 3.5 - 5.0 g/dL   AST 37 15 - 41 U/L   ALT 31 0 - 44 U/L   Alkaline Phosphatase 96 38 - 126 U/L   Total Bilirubin 0.5 0.3 - 1.2 mg/dL   GFR calc non Af Amer >60 >60 mL/min   GFR calc Af Amer >60 >60 mL/min    Comment: (NOTE) The eGFR has been calculated using the CKD EPI equation. This calculation has  not been validated in all clinical situations. eGFR's persistently <60 mL/min signify possible Chronic Kidney Disease.    Anion gap 9 5 - 15    Comment: Performed at [facility], [address] [place], [place] [address]  CBC     Status: Abnormal   Collection Time: 03/13/18  6:16 AM  Result Value Ref Range   WBC 16.6 (H) 3.6 - 11.0 K/uL   RBC 4.52 3.80 - 5.20 MIL/uL   Hemoglobin  14.4 12.0 - 16.0 g/dL   HCT 42.7 35.0 - 47.0 %   MCV 94.4 80.0 - 100.0 fL   MCH 31.9 26.0 - 34.0 pg   MCHC 33.8 32.0 - 36.0 g/dL   RDW 12.8 11.5 - 14.5 %   Platelets 463 (H) 150 - 440 K/uL    Comment: Performed at [facility], [address] [place], [place] [address]  Troponin I     Status: None   Collection Time: 03/13/18  6:16 AM  Result Value Ref Range   Troponin I <0.03 <0.03 ng/mL    Comment: Performed at [facility], [address] [place], [place] [address]  Blood Culture (routine x 2)     Status: None   Collection Time: 03/13/18  8:11 AM  Result Value Ref Range   Specimen Description BLOOD LEFT ANTECUBITAL    Special Requests      BOTTLES DRAWN AEROBIC AND ANAEROBIC Blood Culture adequate volume   Culture      NO GROWTH 5 DAYS Performed at [facility], [address] [place], [place] [address]    Report Status 03/18/2018 FINAL   Blood Culture (routine x 2)     Status: None   Collection Time: 03/13/18  8:11 AM  Result Value Ref Range   Specimen Description BLOOD BLOOD LEFT WRIST    Special Requests      BOTTLES DRAWN AEROBIC AND ANAEROBIC Blood Culture adequate volume   Culture      NO GROWTH 5 DAYS Performed at [facility], [address] [place], [place] [address]    Report Status 03/18/2018 FINAL   MRSA PCR Screening     Status: None   Collection Time: 03/13/18  8:53 AM  Result Value Ref Range   MRSA by PCR NEGATIVE NEGATIVE    Comment:        The GeneXpert MRSA Assay (FDA approved for NASAL specimens only), is one  component of a comprehensive MRSA colonization surveillance program. It is not intended to diagnose MRSA infection nor to guide or monitor treatment for MRSA infections. Performed at [facility], [address] [place], [place] [address]   Urinalysis, Complete w Microscopic     Status: Abnormal   Collection Time: 03/13/18 10:25 AM  Result Value Ref Range   Color, Urine YELLOW (A) YELLOW   APPearance HAZY (A) CLEAR   Specific Gravity, Urine 1.017 1.005 - 1.030   pH 5.0 5.0 - 8.0   Glucose, UA NEGATIVE NEGATIVE mg/dL   Hgb urine dipstick NEGATIVE NEGATIVE   Bilirubin Urine NEGATIVE NEGATIVE   Ketones, ur NEGATIVE NEGATIVE mg/dL   Protein, ur NEGATIVE NEGATIVE mg/dL   Nitrite NEGATIVE NEGATIVE   Leukocytes, UA NEGATIVE NEGATIVE   RBC / HPF 0-5 0 - 5 RBC/hpf   WBC, UA 0-5 0 - 5 WBC/hpf   Bacteria, UA NONE SEEN NONE SEEN   Squamous Epithelial / LPF 6-10 0 - 5   Mucus PRESENT    Non Squamous Epithelial PRESENT (A) NONE SEEN    Comment: Performed at [facility], [address] [place], [place] [address]  Lactic acid, plasma     Status: None   Collection Time: 03/13/18 11:07 AM  Result Value Ref Range   Lactic Acid, Venous 1.4 0.5 - 1.9 mmol/L    Comment: Performed at [facility], [address] [place], [place] [address]  HIV antibody (Routine Testing)     Status: None   Collection  Time: 03/13/18 11:07 AM  Result Value Ref Range   HIV Screen 4th Generation wRfx Non Reactive Non Reactive    Comment: (NOTE) Performed At: [facility] [place], [place] [id number] Rush Farmer MD WL:[id number]   CBC     Status: Abnormal   Collection Time: 03/13/18 11:07 AM  Result Value Ref Range   WBC 21.1 (H) 3.6 - 11.0 K/uL   RBC 4.27 3.80 - 5.20 MIL/uL   Hemoglobin 13.6 12.0 - 16.0 g/dL   HCT 40.0 35.0 - 47.0 %   MCV 93.7 80.0 - 100.0 fL   MCH 32.0 26.0 - 34.0 pg   MCHC 34.1 32.0 - 36.0 g/dL   RDW 12.9 11.5 - 14.5 %    Platelets 494 (H) 150 - 440 K/uL    Comment: Performed at [facility], [address] [place], [place] [address]  Creatinine, serum     Status: None   Collection Time: 03/13/18 11:07 AM  Result Value Ref Range   Creatinine, Ser 0.74 0.44 - 1.00 mg/dL   GFR calc non Af Amer >60 >60 mL/min   GFR calc Af Amer >60 >60 mL/min    Comment: (NOTE) The eGFR has been calculated using the CKD EPI equation. This calculation has not been validated in all clinical situations. eGFR's persistently <60 mL/min signify possible Chronic Kidney Disease. Performed at [facility], [address] [place], [place] [address]   Basic metabolic panel     Status: Abnormal   Collection Time: 03/14/18  5:31 AM  Result Value Ref Range   Sodium 139 135 - 145 mmol/L   Potassium 3.9 3.5 - 5.1 mmol/L   Chloride 107 98 - 111 mmol/L   CO2 25 22 - 32 mmol/L   Glucose, Bld 125 (H) 70 - 99 mg/dL   BUN 11 8 - 23 mg/dL   Creatinine, Ser 0.69 0.44 - 1.00 mg/dL   Calcium 8.7 (L) 8.9 - 10.3 mg/dL   GFR calc non Af Amer >60 >60 mL/min   GFR calc Af Amer >60 >60 mL/min    Comment: (NOTE) The eGFR has been calculated using the CKD EPI equation. This calculation has not been validated in all clinical situations. eGFR's persistently <60 mL/min signify possible Chronic Kidney Disease.    Anion gap 7 5 - 15    Comment: Performed at [facility], [address] [place], [place] [address]  CBC     Status: Abnormal   Collection Time: 03/14/18  5:31 AM  Result Value Ref Range   WBC 31.1 (H) 3.6 - 11.0 K/uL   RBC 3.96 3.80 - 5.20 MIL/uL   Hemoglobin 13.1 12.0 - 16.0 g/dL   HCT 37.2 35.0 - 47.0 %   MCV 93.9 80.0 - 100.0 fL   MCH 33.1 26.0 - 34.0 pg   MCHC 35.3 32.0 - 36.0 g/dL   RDW 13.0 11.5 - 14.5 %   Platelets 483 (H) 150 - 440 K/uL    Comment: Performed at [facility], [address] [place], [place] [address]  Mycoplasma pneumoniae antibody, IgM     Status: None    Collection Time: 03/14/18  5:35 AM  Result Value Ref Range   Mycoplasma pneumo IgM <770 0 - 769 U/mL    Comment: (NOTE)                             Negative            <  770 Clinically significant amount of M. pneumoniae antibody not detected.                             Low Positive   770 - 24 M. pneumoniae specific IgM presumptively detected.  It is recommended that another sample be collected 1-2 weeks later to assure reactivity.                             Positive            >950 Highly significant amount of M. pneumoniae specific IgM antibody detected. Performed At: [facility] [place], [place] [id number] Rush Farmer MD ON:[id number]   CBC     Status: Abnormal   Collection Time: 03/15/18  4:47 AM  Result Value Ref Range   WBC 36.1 (H) 3.6 - 11.0 K/uL   RBC 4.20 3.80 - 5.20 MIL/uL   Hemoglobin 13.5 12.0 - 16.0 g/dL   HCT 40.0 35.0 - 47.0 %   MCV 95.2 80.0 - 100.0 fL   MCH 32.2 26.0 - 34.0 pg   MCHC 33.9 32.0 - 36.0 g/dL   RDW 13.1 11.5 - 14.5 %   Platelets 482 (H) 150 - 440 K/uL    Comment: Performed at [facility], [address] [place], [place] [address]  Basic metabolic panel     Status: Abnormal   Collection Time: 03/15/18  4:47 AM  Result Value Ref Range   Sodium 139 135 - 145 mmol/L   Potassium 3.7 3.5 - 5.1 mmol/L   Chloride 106 98 - 111 mmol/L   CO2 27 22 - 32 mmol/L   Glucose, Bld 113 (H) 70 - 99 mg/dL   BUN 11 8 - 23 mg/dL   Creatinine, Ser 0.62 0.44 - 1.00 mg/dL   Calcium 8.8 (L) 8.9 - 10.3 mg/dL   GFR calc non Af Amer >60 >60 mL/min   GFR calc Af Amer >60 >60 mL/min    Comment: (NOTE) The eGFR has been calculated using the CKD EPI equation. This calculation has not been validated in all clinical situations. eGFR's persistently <60 mL/min signify possible Chronic Kidney Disease.    Anion gap 6 5 - 15    Comment: Performed at [facility], [place], [place] [address]  Procalcitonin -  Baseline     Status: None   Collection Time: 03/15/18  2:21 PM  Result Value Ref Range   Procalcitonin 0.44 ng/mL    Comment:        Interpretation: PCT (Procalcitonin) <= 0.5 ng/mL: Systemic infection (sepsis) is not likely. Local bacterial infection is possible. (NOTE)       Sepsis PCT Algorithm           Lower Respiratory Tract                                      Infection PCT Algorithm    ----------------------------     ----------------------------         PCT < 0.25 ng/mL                PCT < 0.10 ng/mL         Strongly encourage             Strongly discourage   discontinuation  of antibiotics    initiation of antibiotics    ----------------------------     -----------------------------       PCT 0.25 - 0.50 ng/mL            PCT 0.10 - 0.25 ng/mL               OR       >80% decrease in PCT            Discourage initiation of                                            antibiotics      Encourage discontinuation           of antibiotics    ----------------------------     -----------------------------         PCT >= 0.50 ng/mL              PCT 0.26 - 0.50 ng/mL               AND        <80% decrease in PCT             Encourage initiation of                                             antibiotics       Encourage continuation           of antibiotics    ----------------------------     -----------------------------        PCT >= 0.50 ng/mL                  PCT > 0.50 ng/mL               AND         increase in PCT                  Strongly encourage                                      initiation of antibiotics    Strongly encourage escalation           of antibiotics                                     -----------------------------                                           PCT <= 0.25 ng/mL                                                 OR                                        >  80% decrease in PCT                                     Discontinue / Do not initiate                                              antibiotics Performed at [facility], [address] [place], [place] [address]   Procalcitonin     Status: None   Collection Time: 03/16/18  3:26 AM  Result Value Ref Range   Procalcitonin 0.56 ng/mL    Comment:        Interpretation: PCT > 0.5 ng/mL and <= 2 ng/mL: Systemic infection (sepsis) is possible, but other conditions are known to elevate PCT as well. (NOTE)       Sepsis PCT Algorithm           Lower Respiratory Tract                                      Infection PCT Algorithm    ----------------------------     ----------------------------         PCT < 0.25 ng/mL                PCT < 0.10 ng/mL         Strongly encourage             Strongly discourage   discontinuation of antibiotics    initiation of antibiotics    ----------------------------     -----------------------------       PCT 0.25 - 0.50 ng/mL            PCT 0.10 - 0.25 ng/mL               OR       >80% decrease in PCT            Discourage initiation of                                            antibiotics      Encourage discontinuation           of antibiotics    ----------------------------     -----------------------------         PCT >= 0.50 ng/mL              PCT 0.26 - 0.50 ng/mL                AND       <80% decrease in PCT             Encourage initiation of                                             antibiotics       Encourage continuation           of antibiotics    ----------------------------     -----------------------------  PCT >= 0.50 ng/mL                  PCT > 0.50 ng/mL               AND         increase in PCT                  Strongly encourage                                      initiation of antibiotics    Strongly encourage escalation           of antibiotics                                     -----------------------------                                           PCT <= 0.25 ng/mL                                                  OR                                        > 80% decrease in PCT                                     Discontinue / Do not initiate                                             antibiotics Performed at [facility], [address] [place], [place] [address]   CBC     Status: Abnormal   Collection Time: 03/16/18  9:41 AM  Result Value Ref Range   WBC 31.7 (H) 3.6 - 11.0 K/uL   RBC 4.02 3.80 - 5.20 MIL/uL   Hemoglobin 12.8 12.0 - 16.0 g/dL   HCT 37.3 35.0 - 47.0 %   MCV 92.9 80.0 - 100.0 fL   MCH 32.0 26.0 - 34.0 pg   MCHC 34.4 32.0 - 36.0 g/dL   RDW 13.2 11.5 - 14.5 %   Platelets 487 (H) 150 - 440 K/uL    Comment: Performed at [facility], [place], [place] [address]  Lactate dehydrogenase (pleural or peritoneal fluid)     Status: Abnormal   Collection Time: 03/16/18  4:19 PM  Result Value Ref Range   LD, Fluid 1,174 (H) 3 - 23 U/L    Comment: (NOTE) Results should be evaluated in conjunction with serum values    Fluid Type-FLDH CYTOPLEU     Comment: Performed at [facility], [address] [place], [place] [address]  Protein, pleural or peritoneal fluid     Status: None  Collection Time: 03/16/18  4:19 PM  Result Value Ref Range   Total protein, fluid 4.5 g/dL    Comment: (NOTE) No normal range established for this test Results should be evaluated in conjunction with serum values    Fluid Type-FTP CYTOPLEU     Comment: Performed at [facility], [address] [place], [place] [address]  Culture, fungus without smear     Status: None (Preliminary result)   Collection Time: 03/16/18  4:19 PM  Result Value Ref Range   Specimen Description      PLEURAL Performed at [facility], [address], [place], [place] [address]    Special Requests      NONE Performed at [facility], [address] [place], [place] [address]    Culture      NO FUNGUS ISOLATED AFTER 7  DAYS Performed at [facility], [place] [address], [place], [place] [address]    Report Status PENDING   Glucose, pleural or peritoneal fluid     Status: None   Collection Time: 03/16/18  4:19 PM  Result Value Ref Range   Glucose, Fluid 53 mg/dL    Comment: (NOTE) No normal range established for this test Results should be evaluated in conjunction with serum values    Fluid Type-FGLU CYTOPLEU     Comment: Performed at [facility], [address] [place], [place] [address]  Acid Fast Smear (AFB)     Status: None   Collection Time: 03/16/18  4:19 PM  Result Value Ref Range   AFB Specimen Processing Concentration    Acid Fast Smear Negative     Comment: (NOTE) Performed At: [facility] [place], [place] [id number] Rush Farmer MD ON:[id number]    Source (AFB) PLEURAL     Comment: Performed at [facility], [address] [place], [place] [address]  Body fluid cell count with differential     Status: Abnormal   Collection Time: 03/16/18  4:19 PM  Result Value Ref Range   Fluid Type-FCT PLEURAL    Color, Fluid YELLOW YELLOW   Appearance, Fluid HAZY (A) CLEAR   WBC, Fluid 1,185 cu mm   Neutrophil Count, Fluid 82 %   Lymphs, Fluid 3 %   Monocyte-Macrophage-Serous Fluid 14 %   Eos, Fluid 1 %    Comment: Performed at [facility], [address] [place], [place] [address]  Pathologist smear review     Status: None   Collection Time: 03/16/18  4:19 PM  Result Value Ref Range   Path Review      Cytospin slide of right pleural fluid reviewed. Cellular fluid with mostly neutrophils, absolute neutrophil count 972 per cu mm. No malignant cells seen.    Comment: Reviewed by Lemmie Evens. Dicie Beam, MD. Performed at [facility], [address], [place], [place] [address]   Body fluid culture     Status: None   Collection Time: 03/16/18  4:19 PM  Result Value Ref Range   Specimen Description      PLEURAL Performed at  [facility], [address] [place], [place] [address]    Special Requests      Normal Performed at [facility], [place], [place] [address]    Gram Stain NO WBC SEEN NO ORGANISMS SEEN     Culture      NO GROWTH 3 DAYS Performed at [facility], [place] [address]  [place], [place] [address]    Report Status 03/20/2018 FINAL   Procalcitonin     Status: None   Collection Time: 03/17/18  5:55 AM  Result Value Ref Range   Procalcitonin 0.37 ng/mL    Comment:        Interpretation: PCT (Procalcitonin) <= 0.5 ng/mL: Systemic infection (sepsis) is not likely. Local bacterial infection is possible. (NOTE)       Sepsis PCT Algorithm           Lower Respiratory Tract                                      Infection PCT Algorithm    ----------------------------     ----------------------------         PCT < 0.25 ng/mL                PCT < 0.10 ng/mL         Strongly encourage             Strongly discourage   discontinuation of antibiotics    initiation of antibiotics    ----------------------------     -----------------------------       PCT 0.25 - 0.50 ng/mL            PCT 0.10 - 0.25 ng/mL               OR       >80% decrease in PCT            Discourage initiation of                                            antibiotics      Encourage discontinuation           of antibiotics    ----------------------------     -----------------------------         PCT >= 0.50 ng/mL              PCT 0.26 - 0.50 ng/mL               AND        <80% decrease in PCT             Encourage initiation of                                             antibiotics       Encourage continuation           of antibiotics    ----------------------------     -----------------------------        PCT >= 0.50 ng/mL                  PCT > 0.50 ng/mL               AND         increase in PCT                  Strongly encourage  initiation  of antibiotics    Strongly encourage escalation           of antibiotics                                     -----------------------------                                           PCT <= 0.25 ng/mL                                                 OR                                        > 80% decrease in PCT                                     Discontinue / Do not initiate                                             antibiotics Performed at [facility], [address] [place], [place] [address]   CBC     Status: Abnormal   Collection Time: 03/17/18  5:55 AM  Result Value Ref Range   WBC 22.7 (H) 3.6 - 11.0 K/uL   RBC 3.79 (L) 3.80 - 5.20 MIL/uL   Hemoglobin 12.0 12.0 - 16.0 g/dL   HCT 35.0 35.0 - 47.0 %   MCV 92.4 80.0 - 100.0 fL   MCH 31.6 26.0 - 34.0 pg   MCHC 34.2 32.0 - 36.0 g/dL   RDW 13.2 11.5 - 14.5 %   Platelets 459 (H) 150 - 440 K/uL    Comment: Performed at [facility], [address] [place], [place] [address]  Basic metabolic panel     Status: Abnormal   Collection Time: 03/17/18  5:55 AM  Result Value Ref Range   Sodium 140 135 - 145 mmol/L   Potassium 3.1 (L) 3.5 - 5.1 mmol/L   Chloride 103 98 - 111 mmol/L   CO2 30 22 - 32 mmol/L   Glucose, Bld 109 (H) 70 - 99 mg/dL   BUN 12 8 - 23 mg/dL   Creatinine, Ser 0.61 0.44 - 1.00 mg/dL   Calcium 8.4 (L) 8.9 - 10.3 mg/dL   GFR calc non Af Amer >60 >60 mL/min   GFR calc Af Amer >60 >60 mL/min    Comment: (NOTE) The eGFR has been calculated using the CKD EPI equation. This calculation has not been validated in all clinical situations. eGFR's persistently <60 mL/min signify possible Chronic Kidney Disease.    Anion gap 7 5 - 15    Comment: Performed at [facility], [address] [place], [place] [address]  Vancomycin, trough     Status: Abnormal   Collection Time: 03/18/18 11:13 AM  Result Value Ref Range  Vancomycin Tr 5 (L) 15 - 20 ug/mL    Comment: Performed at [facility], [address] [place], [place] [address]  CBC     Status: Abnormal   Collection Time: 03/18/18 11:13 AM  Result Value Ref Range   WBC 21.9 (H) 3.6 - 11.0 K/uL   RBC 3.79 (L) 3.80 - 5.20 MIL/uL   Hemoglobin 12.3 12.0 - 16.0 g/dL   HCT 35.2 35.0 - 47.0 %   MCV 92.8 80.0 - 100.0 fL   MCH 32.5 26.0 - 34.0 pg   MCHC 35.1 32.0 - 36.0 g/dL   RDW 13.2 11.5 - 14.5 %   Platelets 449 (H) 150 - 440 K/uL    Comment: Performed at [facility], [address] [place], [place] [address]  Basic metabolic panel     Status: Abnormal   Collection Time: 03/18/18 11:13 AM  Result Value Ref Range   Sodium 141 135 - 145 mmol/L   Potassium 3.6 3.5 - 5.1 mmol/L   Chloride 103 98 - 111 mmol/L   CO2 32 22 - 32 mmol/L   Glucose, Bld 119 (H) 70 - 99 mg/dL   BUN 12 8 - 23 mg/dL   Creatinine, Ser 0.66 0.44 - 1.00 mg/dL   Calcium 8.5 (L) 8.9 - 10.3 mg/dL   GFR calc non Af Amer >60 >60 mL/min   GFR calc Af Amer >60 >60 mL/min    Comment: (NOTE) The eGFR has been calculated using the CKD EPI equation. This calculation has not been validated in all clinical situations. eGFR's persistently <60 mL/min signify possible Chronic Kidney Disease.    Anion gap 6 5 - 15    Comment: Performed at [facility], [address] [place], [place] [address]  CBC     Status: Abnormal   Collection Time: 03/19/18  6:05 AM  Result Value Ref Range   WBC 18.8 (H) 3.6 - 11.0 K/uL   RBC 3.57 (L) 3.80 - 5.20 MIL/uL   Hemoglobin 11.6 (L) 12.0 - 16.0 g/dL   HCT 33.7 (L) 35.0 - 47.0 %   MCV 94.4 80.0 - 100.0 fL   MCH 32.3 26.0 - 34.0 pg   MCHC 34.3 32.0 - 36.0 g/dL   RDW 13.5 11.5 - 14.5 %   Platelets 451 (H) 150 - 440 K/uL    Comment: Performed at [facility], [address] [place], [place] [address]  CBC with Differential/Platelet     Status: Abnormal   Collection Time: 03/20/18  5:37 AM  Result Value Ref Range   WBC 16.3 (H) 3.6 - 11.0 K/uL   RBC 3.51 (L) 3.80 - 5.20 MIL/uL    Hemoglobin 11.3 (L) 12.0 - 16.0 g/dL   HCT 32.9 (L) 35.0 - 47.0 %   MCV 93.8 80.0 - 100.0 fL   MCH 32.3 26.0 - 34.0 pg   MCHC 34.4 32.0 - 36.0 g/dL   RDW 13.4 11.5 - 14.5 %   Platelets 483 (H) 150 - 440 K/uL   Neutrophils Relative % 77 %   Neutro Abs 12.5 (H) 1.4 - 6.5 K/uL   Lymphocytes Relative 12 %   Lymphs Abs 1.9 1.0 - 3.6 K/uL   Monocytes Relative 10 %   Monocytes Absolute 1.6 (H) 0.2 - 0.9 K/uL   Eosinophils Relative 2 %   Eosinophils Absolute 0.2 0 - 0.7 K/uL   Basophils Relative 1 %   Basophils Absolute 0.1 0 - 0.1 K/uL  Comment: Performed at [facility], [address] [place], [place] [address]  Creatinine, serum     Status: None   Collection Time: 03/20/18  5:37 AM  Result Value Ref Range   Creatinine, Ser 0.91 0.44 - 1.00 mg/dL   GFR calc non Af Amer >60 >60 mL/min   GFR calc Af Amer >60 >60 mL/min    Comment: (NOTE) The eGFR has been calculated using the CKD EPI equation. This calculation has not been validated in all clinical situations. eGFR's persistently <60 mL/min signify possible Chronic Kidney Disease. Performed at [facility], [address] [place], [place] [address]   Vancomycin, trough     Status: Abnormal   Collection Time: 03/20/18  1:51 PM  Result Value Ref Range   Vancomycin Tr 22 (HH) 15 - 20 ug/mL    Comment: CRITICAL RESULT CALLED TO, READ BACK BY AND VERIFIED WITH SAVANNAH MARTIN 03/20/18 AT 1435 KBH Performed at [facility], [address] [place], [place] [address]   CBC with Differential/Platelet     Status: Abnormal   Collection Time: 03/21/18  4:36 AM  Result Value Ref Range   WBC 16.9 (H) 3.6 - 11.0 K/uL   RBC 3.62 (L) 3.80 - 5.20 MIL/uL   Hemoglobin 11.5 (L) 12.0 - 16.0 g/dL   HCT 33.8 (L) 35.0 - 47.0 %   MCV 93.3 80.0 - 100.0 fL   MCH 31.7 26.0 - 34.0 pg   MCHC 34.0 32.0 - 36.0 g/dL   RDW 13.3 11.5 - 14.5 %   Platelets 545 (H) 150 - 440 K/uL   Neutrophils Relative % 74 %   Neutro Abs  12.5 (H) 1.4 - 6.5 K/uL   Lymphocytes Relative 15 %   Lymphs Abs 2.5 1.0 - 3.6 K/uL   Monocytes Relative 10 %   Monocytes Absolute 1.6 (H) 0.2 - 0.9 K/uL   Eosinophils Relative 2 %   Eosinophils Absolute 0.3 0 - 0.7 K/uL   Basophils Relative 1 %   Basophils Absolute 0.1 0 - 0.1 K/uL    Comment: Performed at [facility], [address] [place], [place] [address]  CBC with Differential/Platelet     Status: Abnormal   Collection Time: 03/22/18  6:30 AM  Result Value Ref Range   WBC 17.4 (H) 3.6 - 11.0 K/uL   RBC 3.51 (L) 3.80 - 5.20 MIL/uL   Hemoglobin 11.9 (L) 12.0 - 16.0 g/dL   HCT 32.8 (L) 35.0 - 47.0 %   MCV 93.4 80.0 - 100.0 fL   MCH 33.8 26.0 - 34.0 pg   MCHC 36.2 (H) 32.0 - 36.0 g/dL   RDW 13.7 11.5 - 14.5 %   Platelets 541 (H) 150 - 440 K/uL   Neutrophils Relative % 73 %   Neutro Abs 12.8 (H) 1.4 - 6.5 K/uL   Lymphocytes Relative 15 %   Lymphs Abs 2.6 1.0 - 3.6 K/uL   Monocytes Relative 9 %   Monocytes Absolute 1.6 (H) 0.2 - 0.9 K/uL   Eosinophils Relative 2 %   Eosinophils Absolute 0.3 0 - 0.7 K/uL   Basophils Relative 1 %   Basophils Absolute 0.1 0 - 0.1 K/uL    Comment: Performed at [facility], [address] [place],  [address]  CBC with Differential/Platelet     Status: Abnormal   Collection Time: 03/23/18  6:30 AM  Result Value Ref Range   WBC 15.7 (H) 3.6 - 11.0 K/uL   RBC  3.71 (L) 3.80 - 5.20 MIL/uL   Hemoglobin 12.0 12.0 - 16.0 g/dL   HCT 34.9 (L) 35.0 - 47.0 %   MCV 93.8 80.0 - 100.0 fL   MCH 32.2 26.0 - 34.0 pg   MCHC 34.3 32.0 - 36.0 g/dL   RDW 13.7 11.5 - 14.5 %   Platelets 595 (H) 150 - 440 K/uL   Neutrophils Relative % 70 %   Neutro Abs 11.0 (H) 1.4 - 6.5 K/uL   Lymphocytes Relative 18 %   Lymphs Abs 2.8 1.0 - 3.6 K/uL   Monocytes Relative 10 %   Monocytes Absolute 1.5 (H) 0.2 - 0.9 K/uL   Eosinophils Relative 1 %   Eosinophils Absolute 0.2 0 - 0.7 K/uL   Basophils Relative 1 %   Basophils Absolute 0.1 0 - 0.1  K/uL    Comment: Performed at [facility], [address] [place], [place] [phone]    Radiology: Dg Chest 2 View  Result Date: 03/13/2018 CLINICAL DATA:  Initial evaluation for acute right upper quadrant pain, recently diagnosed with pneumonia. EXAM: CHEST - 2 VIEW COMPARISON:  Prior radiograph from 12/07/2014. FINDINGS: Mild cardiomegaly.  Mediastinal silhouette within normal limits. Lungs hypoinflated. Acute right lower lobe infiltrate, most consistent with pneumonia. Probable small bilateral pleural effusions. No pulmonary edema. No pneumothorax. No acute osseous abnormality. IMPRESSION: 1. Acute right lower lobe infiltrate, highly suggestive of pneumonia. 2. Probable small bilateral pleural effusions. Electronically Signed   By: Jeannine Boga M.D.   On: 03/13/2018 06:50   Ct Angio Chest Pe W And/or Wo Contrast  Result Date: 03/13/2018 CLINICAL DATA:  Cough, shortness of breath, and congestion. EXAM: CT ANGIOGRAPHY CHEST WITH CONTRAST TECHNIQUE: Multidetector CT imaging of the chest was performed using the standard protocol during bolus administration of intravenous contrast. Multiplanar CT image reconstructions and MIPs were obtained to evaluate the vascular anatomy. CONTRAST:  46m ISOVUE-370 IOPAMIDOL (ISOVUE-370) INJECTION 76% COMPARISON:  Chest x-ray from same day. FINDINGS: Cardiovascular: Satisfactory opacification of the pulmonary arteries to the segmental level. No evidence of pulmonary embolism. Normal heart size. No pericardial effusion. Normal caliber thoracic aorta. No aortic dissection. Mediastinum/Nodes: Prominent mediastinal and right hilar lymph nodes measuring up to 10 mm in short axis, likely reactive. No axillary lymphadenopathy. The thyroid gland, trachea, and esophagus demonstrate no significant findings. Lungs/Pleura: Nonenhancing consolidation within the right middle lobe. Small right pleural effusion with adjacent right lower lobe atelectasis. No pleural  enhancement. Scattered peripheral centrilobular nodules in the right upper lobe. Subsegmental atelectasis in the lingula and left lower lobe. 6 mm subpleural nodule in the posterior left upper lobe (series 6, image 21) Upper Abdomen: No acute abnormality. Prior splenectomy. 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 Additional adjacent small splenules as well. Musculoskeletal: No chest wall abnormality. No acute or significant osseous findings. Review of the MIP images confirms the above findings. IMPRESSION: 1. Non-enhancing consolidation within the right middle lobe, most consistent with pneumonia given clinical history. Additional scattered peripheral centrilobular nodules in the right upper lobe are likely infectious or inflammatory. Followup PA and lateral chest X-ray is recommended in 3-4 weeks following trial of antibiotic therapy to ensure resolution and exclude underlying malignancy. 2.  No evidence of pulmonary embolism. 3. Small right pleural effusion with adjacent right lower lobe atelectasis. No pleural enhancement. 4. 6 mm left upper lobe nodule. Non-contrast chest CT at 6-12 months is recommended. If  the nodule is stable at time of repeat CT, then future CT at 18-24 months (from today's scan) is considered optional for low-risk patients, but is recommended for high-risk patients. This recommendation follows the consensus statement: Guidelines for Management of Incidental Pulmonary Nodules Detected on CT Images: From the Fleischner Society 2017; Radiology 2017; 284:228-243. Electronically Signed   By: Titus Dubin M.D.   On: 03/13/2018 10:19    No results found.  Dg Chest 2 View  Result Date: 03/15/2018 CLINICAL DATA:  History of pneumonia. EXAM: CHEST - 2 VIEW COMPARISON:  Chest CT 03/13/2018; chest radiograph 03/13/2018 FINDINGS: Stable cardiac and mediastinal contours. Significant interval increase in  consolidation throughout the right lung. Small right pleural effusion. No pneumothorax. Thoracic spine degenerative changes. IMPRESSION: Worsening right lung consolidation compatible with pneumonia. Electronically Signed   By: Lovey Newcomer M.D.   On: 03/15/2018 07:30   Dg Chest 2 View  Result Date: 03/13/2018 CLINICAL DATA:  Initial evaluation for acute right upper quadrant pain, recently diagnosed with pneumonia. EXAM: CHEST - 2 VIEW COMPARISON:  Prior radiograph from 12/07/2014. FINDINGS: Mild cardiomegaly.  Mediastinal silhouette within normal limits. Lungs hypoinflated. Acute right lower lobe infiltrate, most consistent with pneumonia. Probable small bilateral pleural effusions. No pulmonary edema. No pneumothorax. No acute osseous abnormality. IMPRESSION: 1. Acute right lower lobe infiltrate, highly suggestive of pneumonia. 2. Probable small bilateral pleural effusions. Electronically Signed   By: Jeannine Boga M.D.   On: 03/13/2018 06:50   Ct Chest W Contrast  Result Date: 03/19/2018 CLINICAL DATA:  Follow-up pneumonia, right chest tube EXAM: CT CHEST WITH CONTRAST TECHNIQUE: Multidetector CT imaging of the chest was performed during intravenous contrast administration. CONTRAST:  64m OMNIPAQUE IOHEXOL 300 MG/ML  SOLN COMPARISON:  03/15/2018 FINDINGS: Cardiovascular: Heart is normal in size.  No pericardial effusion. No evidence of thoracic aortic aneurysm. Right arm PICC terminates at the cavoatrial junction. Mediastinum/Nodes: Mediastinal lymphadenopathy, including a dominant 1.3 cm short axis low right paratracheal node and a 1.4 cm short axis subcarinal node, likely reactive. Visualized thyroid is unremarkable. Lungs/Pleura: Right middle lobe opacity/consolidation with air bronchograms, compatible with pneumonia. Additional patchy opacity in the right lower lobe and to a lesser extent the right upper lobe. Indwelling pigtail chest tube in the medial right lower hemithorax. Trace enhancing  pleural fluid/empyema overlying the right middle lobe (series 3/image 39). Additional small right pleural effusion with enhancing rim posteriorly at the right lung base (series 3/image 106), significantly improved. Minimal patchy opacity in the left upper lobe/lingula and left lung base, likely atelectasis, less likely mild infection. Trace left pleural effusion. No pneumothorax. Upper Abdomen: Visualized upper abdomen is grossly unremarkable. Musculoskeletal: Visualized osseous structures are within normal limits. IMPRESSION: Multifocal right lung pneumonia, right middle lobe predominant. Small right pleural effusion/empyema, decreased, with indwelling pigtail drainage catheter. No pneumothorax. Mild mediastinal lymphadenopathy, likely reactive. Additional ancillary findings as above. Electronically Signed   By: SJulian HyM.D.   On: 03/19/2018 11:19   Ct Chest W Contrast  Result Date: 03/15/2018 CLINICAL DATA:  Follow-up pleural effusion, pneumonia EXAM: CT CHEST WITH CONTRAST TECHNIQUE: Multidetector CT imaging of the chest was performed during intravenous contrast administration. CONTRAST:  763mOMNIPAQUE IOHEXOL 300 MG/ML  SOLN COMPARISON:  CT 03/13/2018.  Chest x-ray 03/15/2018 FINDINGS: Cardiovascular: Heart is normal size.  Aorta is normal caliber. Mediastinum/Nodes: Enlarging mediastinal lymph nodes. Right paratracheal lymph node has a short axis diameter of 14 mm. Low right paratracheal lymph node has a short axis diameter of 12  mm. Subcarinal lymph node has a short axis diameter of 17 mm. These are slightly more prominent than on prior CT. Lungs/Pleura: Enlarging loculated right pleural effusion, now large in size with loculations noted posteriorly and laterally. Small left pleural effusion. Consolidation in the right middle lobe and right lower lobe similar to prior study compatible with pneumonia. Compressive atelectasis in the left lower lobe. Posterior left upper lobe nodule again noted,  unchanged. Upper Abdomen: Imaging into the upper abdomen shows no acute findings. Musculoskeletal: Chest wall soft tissues are unremarkable. No acute bony abnormality. IMPRESSION: Enlarging large left pleural effusion which is loculated posteriorly and laterally. Continued consolidation in the right lower lobe and right middle lobe compatible with pneumonia, not significantly changed. Small left effusion with left base atelectasis. Stable left upper lobe nodule. Mediastinal adenopathy, presumably reactive. This could be followed on subsequent imaging. Electronically Signed   By: Rolm Baptise M.D.   On: 03/15/2018 13:25   Ct Angio Chest Pe W And/or Wo Contrast  Result Date: 03/13/2018 CLINICAL DATA:  Cough, shortness of breath, and congestion. EXAM: CT ANGIOGRAPHY CHEST WITH CONTRAST TECHNIQUE: Multidetector CT imaging of the chest was performed using the standard protocol during bolus administration of intravenous contrast. Multiplanar CT image reconstructions and MIPs were obtained to evaluate the vascular anatomy. CONTRAST:  50m ISOVUE-370 IOPAMIDOL (ISOVUE-370) INJECTION 76% COMPARISON:  Chest x-ray from same day. FINDINGS: Cardiovascular: Satisfactory opacification of the pulmonary arteries to the segmental level. No evidence of pulmonary embolism. Normal heart size. No pericardial effusion. Normal caliber thoracic aorta. No aortic dissection. Mediastinum/Nodes: Prominent mediastinal and right hilar lymph nodes measuring up to 10 mm in short axis, likely reactive. No axillary lymphadenopathy. The thyroid gland, trachea, and esophagus demonstrate no significant findings. Lungs/Pleura: Nonenhancing consolidation within the right middle lobe. Small right pleural effusion with adjacent right lower lobe atelectasis. No pleural enhancement. Scattered peripheral centrilobular nodules in the right upper lobe. Subsegmental atelectasis in the lingula and left lower lobe. 6 mm subpleural nodule in the posterior left  upper lobe (series 6, image 21) Upper Abdomen: No acute abnormality. Prior splenectomy. 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 Additional adjacent small splenules as well. Musculoskeletal: No chest wall abnormality. No acute or significant osseous findings. Review of the MIP images confirms the above findings. IMPRESSION: 1. Non-enhancing consolidation within the right middle lobe, most consistent with pneumonia given clinical history. Additional scattered peripheral centrilobular nodules in the right upper lobe are likely infectious or inflammatory. Followup PA and lateral chest X-ray is recommended in 3-4 weeks following trial of antibiotic therapy to ensure resolution and exclude underlying malignancy. 2.  No evidence of pulmonary embolism. 3. Small right pleural effusion with adjacent right lower lobe atelectasis. No pleural enhancement. 4. 6 mm left upper lobe nodule. Non-contrast chest CT at 6-12 months is recommended. If the nodule is stable at time of repeat CT, then future CT at 18-24 months (from today's scan) is considered optional for low-risk patients, but is recommended for high-risk patients. This recommendation follows the consensus statement: Guidelines for Management of Incidental Pulmonary Nodules Detected on CT Images: From the Fleischner Society 2017; Radiology 2017; 284:228-243. Electronically Signed   By: WTitus DubinM.D.   On: 03/13/2018 10:19   [place]hest (pleural Effusion)  Result Date: 03/15/2018 CLINICAL DATA:  Increasing right-sided pleural effusion EXAM: CHEST ULTRASOUND COMPARISON:  CT from earlier in the same day. FINDINGS: There  is an enlarging right-sided pleural effusion. Initial scanning was performed as a prelude to possible thoracentesis although the fluid is shown to be significantly multiloculated and not likely to respond adequately to percutaneous drainage. Large bore chest tube  drainage is likely necessary. IMPRESSION: Significant loculated increased pleural effusion on the right similar to that seen on recent chest CT. Small bore catheter drainage is not likely to be significantly helpful for the patient. These findings were communicated to Dr. Anselm Jungling at the time of exam performance. Electronically Signed   By: Inez Catalina M.D.   On: 03/15/2018 15:58   Dg Chest Port 1 View  Result Date: 03/18/2018 CLINICAL DATA:  Right chest tube. EXAM: PORTABLE CHEST 1 VIEW COMPARISON:  03/17/2018. FINDINGS: Right PICC line and right chest tube in stable position. No pneumothorax. Right base atelectasis/infiltrate, improved from prior exam. Mild left base atelectasis. Small bilateral pleural effusions again noted. Heart size stable. IMPRESSION: One right PICC line right chest pneumothorax.Again previously identified loculated right pleural fluid collections have resolved. 2. Right base atelectasis/infiltrate, improved from prior exam. Mild left base atelectasis. Small bilateral pleural effusions again noted. Electronically Signed   By: Marcello Moores  Register   On: 03/18/2018 08:06   Dg Chest Port 1 View  Result Date: 03/17/2018 CLINICAL DATA:  History of pneumonia. EXAM: PORTABLE CHEST 1 VIEW COMPARISON:  Chest x-ray 03/15/2018.  Chest CT 03/15/2018. FINDINGS: Right PICC line noted with tip over cavoatrial junction. Right chest tube noted over the mid right chest. Previously identified large loculated pleural fluid collection appears resolved significantly. Right base atelectasis/infiltrate noted. Small bilateral pleural effusions. IMPRESSION: 1. Right PICC line noted with tip over cavoatrial junction. Right chest tube noted over the right mid chest. Previously identified large loculated right pleural effusion appears to have resolved significantly. Small bilateral pleural effusions. 2.  Right base atelectasis/infiltrate. Electronically Signed   By: Marcello Moores  Register   On: 03/17/2018 08:04   [place] Ekg  Site Rite  Result Date: 03/16/2018 If Site Rite image not attached, placement could not be confirmed due to current cardiac rhythm.  [place] Perc Pleural Drain W/indwell Cath W/img Guide  Result Date: 03/16/2018 INDICATION: Loculated right pleural effusion demonstrating complex appearance by ultrasound. Request has been made to place a thoracostomy tube for drainage purposes. EXAM: ULTRASOUND-GUIDED PLACEMENT OF RIGHT PLEURAL THORACOSTOMY TUBE MEDICATIONS: The patient is currently admitted to the hospital and receiving intravenous antibiotics. The antibiotics were administered within an appropriate time frame prior to the initiation of the procedure. ANESTHESIA/SEDATION: Fentanyl 75 mcg IV; Versed 2.0 mg IV Moderate Sedation Time:  21 minutes. The patient was continuously monitored during the procedure by the interventional radiology nurse under my direct supervision. COMPLICATIONS: None immediate. PROCEDURE: Informed written consent was obtained from the patient after a thorough discussion of the procedural risks, benefits and alternatives. All questions were addressed. Maximal Sterile Barrier Technique was utilized including caps, mask, sterile gowns, sterile gloves, sterile drape, hand hygiene and skin antiseptic. A timeout was performed prior to the initiation of the procedure. Ultrasound was performed of the right pleural space with the patient in a decubitus position with the right side up. Local anesthesia was provided with 1% lidocaine. Under ultrasound guidance, an 18 gauge trocar needle was advanced into the right pleural space from a posterior approach. After return of fluid, a guidewire was advanced. The tract was dilated over the wire and a 14 French pigtail drainage catheter advanced. Catheter course was confirmed by ultrasound. A 120 mL sample of fluid was  sent for requested laboratory testing. The catheter was connected to a Pleur-evac device. The catheter was secured at the skin with a Prolene  retention suture and StatLock device. FINDINGS: Complex appearing loculated pleural effusion again present by ultrasound. There was return of clear appearing dark yellow fluid. IMPRESSION: Placement of 14 French right thoracostomy tube to treat each a loculated right pleural effusion. A fluid sample was sent for requested laboratory testing. Electronically Signed   By: Aletta Edouard M.D.   On: 03/16/2018 17:04      Assessment and Plan: Patient Active Problem List   Diagnosis Date Noted  . Pleural effusion on right   . Community acquired pneumonia 03/13/2018  . Pneumonia 03/13/2018    1. HCAP  Patient's treated with IV antibiotics while hospital clinically she has improved.  She does need to have a follow-up chest x-ray to make sure that there has been resolution of the fluid  As well as the pneumonia. 2. Right Pleural Effusion  The results of the cytology were negative the results of the chemistry on the fluid was suggestive of a complex pleural effusion empyema with predominant neutrophils and a low glucose an elevated LDH as well as protein.  I have ordered a follow-up chest x-ray to re-evaluate the pleural effusion on the right side 3. TBI she is at baseline we will continue with supportive care  General Counseling: I have discussed the findings of the evaluation and examination with Ivin Booty.  I have also discussed any further diagnostic evaluation thatmay be needed or ordered today. Alynna verbalizes understanding of the findings of todays visit. We also reviewed her medications today and discussed drug interactions and side effects including but not limited excessive drowsiness and altered mental states. We also discussed that there is always a risk not just to her but also people around her. she has been encouraged to call the office with any questions or concerns that should arise related to todays visit.    Time spent: 54mn  I have personally obtained a history, examined the patient,  evaluated laboratory and imaging results, formulated the assessment and plan and placed orders.    SAllyne Gee MD FThe [facility] Sleep medicine

## 2018-04-03 NOTE — Discharge Summary (Signed)
[facility] - [place] at [facility]   PATIENT NAME: Brittany [name]    MR#:  [id number]  DATE OF BIRTH:  03/23/1951  DATE OF ADMISSION:  03/13/2018 ADMITTING PHYSICIAN: Altamese Dilling, MD  DATE OF DISCHARGE: 03/23/2018 11:56 AM  PRIMARY CARE PHYSICIAN: Danella Penton, MD   ADMISSION DIAGNOSIS:  Healthcare-associated pneumonia [J18.9] Sepsis, due to unspecified organism [facility]) [A41.9]  DISCHARGE DIAGNOSIS:  Principal Problem:   Community acquired pneumonia Active Problems:   Pneumonia   Pleural effusion on right   SECONDARY DIAGNOSIS:   Past Medical History:  Diagnosis Date  . Speech impairment   . TBI (traumatic brain injury) (HCC)      ADMITTING HISTORY  HISTORY OF PRESENT ILLNESS: Brittany [name]  is a 68 y.o. female with a known history of traumatic brain injury and speech impairment, lives in a group home and on pured diet with nectar thick liquids. Was having pneumonia and under treatment with oral Levaquin for last few days but continued to have worsening of cough and right-sided chest pains are sent to emergency room where she is noted to be hypoxic and infiltrate on the lungs, tachycardia. ER physician suggested to admit to hospitalist team because of failure of outpatient treatment.  HOSPITAL COURSE:   *Sepsis with right-sided pneumonia.  Due to worsening antibiotics have been broadened. Cultures negative Initially was on broad-spectrum antibiotics and later changed to IV Zosyn.  Prior to discharge patient was switched to oral Augmentin.  WBC trending down.  Cultures continue to be negative. Discussed with Dr. Joylene Draft Discharge home with Augmentin to finish total 4-week course.  Follow-up with primary care physician and pulmonary as outpatient.  *Right pleural effusion/empyema On IV antibiotics in the hospital.  Changed to oral Augmentin Chest tube removed.  Discussed with Dr. Thelma Barge.  Follow-up with him in the  office.  *Dysphagia Due to traumatic brain injury, continue her diet as per preadmission.  Patient stable for discharge home with home health services.  CONSULTS OBTAINED:  Treatment Team:  Yevonne Pax, MD Hulda Marin, MD Lynn Ito, MD  DRUG ALLERGIES:   Allergies  Allergen Reactions  . Sulfa Antibiotics Nausea Only and Rash    DISCHARGE MEDICATIONS:   Allergies as of 03/23/2018      Reactions   Sulfa Antibiotics Nausea Only, Rash      Medication List    STOP taking these medications   levofloxacin 500 MG tablet Commonly known as:  LEVAQUIN     TAKE these medications   amoxicillin-clavulanate 875-125 MG tablet Commonly known as:  AUGMENTIN Take 1 tablet by mouth 2 (two) times daily.   diazepam 10 MG tablet Commonly known as:  VALIUM Take 10 mg by mouth at bedtime as needed.   pseudoephedrine 30 MG tablet Commonly known as:  SUDAFED Take 60 mg by mouth 2 (two) times daily as needed for congestion.   traMADol 50 MG tablet Commonly known as:  ULTRAM Take 1 tablet (50 mg total) by mouth every 6 (six) hours as needed for severe pain.       Today   VITAL SIGNS:  Blood pressure 126/73, pulse 96, temperature 98.5 F (36.9 C), temperature source Oral, resp. rate 20, height 5\' 4"  (1.626 m), weight 65 kg, SpO2 97 %.  I/O:  No intake or output data in the 24 hours ending 04/03/18 1202  PHYSICAL EXAMINATION:  Physical Exam  GENERAL:  67 y.o.-year-old patient lying in the bed with no acute distress.  LUNGS: Normal breath  sounds bilaterally, no wheezing, rales,rhonchi or crepitation. No use of accessory muscles of respiration.  CARDIOVASCULAR: S1, S2 normal. No murmurs, rubs, or gallops.  ABDOMEN: Soft, non-tender, non-distended. Bowel sounds present. No organomegaly or mass.  NEUROLOGIC: Moves all 4 extremities. PSYCHIATRIC: The patient is alert and oriented x 3.  SKIN: No obvious rash, lesion, or ulcer.   DATA REVIEW:   CBC No results  for input(s): WBC, HGB, HCT, PLT in the last 168 hours.  Chemistries  No results for input(s): NA, K, CL, CO2, GLUCOSE, BUN, CREATININE, CALCIUM, MG, AST, ALT, ALKPHOS, BILITOT in the last 168 hours.  Invalid input(s): GFRCGP  Cardiac Enzymes No results for input(s): TROPONINI in the last 168 hours.  Microbiology Results  Results for orders placed or performed during the hospital encounter of 03/13/18  Blood Culture (routine x 2)     Status: None   Collection Time: 03/13/18  8:11 AM  Result Value Ref Range Status   Specimen Description BLOOD LEFT ANTECUBITAL  Final   Special Requests   Final    BOTTLES DRAWN AEROBIC AND ANAEROBIC Blood Culture adequate volume   Culture   Final    NO GROWTH 5 DAYS Performed at [facility] [address] [place], [place] [address]    Report Status 03/18/2018 FINAL  Final  Blood Culture (routine x 2)     Status: None   Collection Time: 03/13/18  8:11 AM  Result Value Ref Range Status   Specimen Description BLOOD BLOOD LEFT WRIST  Final   Special Requests   Final    BOTTLES DRAWN AEROBIC AND ANAEROBIC Blood Culture adequate volume   Culture   Final    NO GROWTH 5 DAYS Performed at [facility], [address], [place], [place] [address]    Report Status 03/18/2018 FINAL  Final  MRSA PCR Screening     Status: None   Collection Time: 03/13/18  8:53 AM  Result Value Ref Range Status   MRSA by PCR NEGATIVE NEGATIVE Final    Comment:        The GeneXpert MRSA Assay (FDA approved for NASAL specimens only), is one component of a comprehensive MRSA colonization surveillance program. It is not intended to diagnose MRSA infection nor to guide or monitor treatment for MRSA infections. Performed at [facility], [address] [place], [place] [address]   Culture, fungus without smear     Status: None (Preliminary result)   Collection Time: 03/16/18  4:19 PM  Result Value Ref Range Status   Specimen Description    Final    PLEURAL Performed at [facility], [address] [place], [place] [address]    Special Requests   Final    NONE Performed at [facility], [address], [place], [place] [address]    Culture   Final    NO FUNGUS ISOLATED AFTER 14 DAYS Performed at [facility], [address] [place], [place] [address]    Report Status PENDING  Incomplete  Acid Fast Smear (AFB)     Status: None   Collection Time: 03/16/18  4:19 PM  Result Value Ref Range Status   AFB Specimen Processing Concentration  Final   Acid Fast Smear Negative  Final    Comment: (NOTE) Performed At: [facility] [address] [place], [place] [id number] Jolene Schimke MD MW:[id number]    Source (AFB) PLEURAL  Final    Comment: Performed at [facility], [address] [facility]  [address] [place], [place] [address]  Body fluid culture     Status: None   Collection Time: 03/16/18  4:19 PM  Result Value Ref Range Status   Specimen Description   Final    PLEURAL Performed at [facility], [address] [place], [place] [address]    Special Requests   Final    Normal Performed at [facility], [address] [place], [place] [address]    Gram Stain NO WBC SEEN NO ORGANISMS SEEN   Final   Culture   Final    NO GROWTH 3 DAYS Performed at [facility], [address] [place], [place] [address]    Report Status 03/20/2018 FINAL  Final    RADIOLOGY:  No results found.  Follow up with PCP in 1 week.  Management plans discussed with the patient, family and they are in agreement.  CODE STATUS:  Code Status History    Date Active Date Inactive Code Status Order ID Comments User Context   03/13/2018 1052 03/23/2018 1552 Full Code [id number]  Altamese Dilling, MD Inpatient    Advance Directive Documentation     Most Recent Value  Type of Advance Directive  Healthcare Power of Attorney, Living will  Pre-existing out of facility DNR order  (yellow form or pink MOST form)  -  "MOST" Form in Place?  -      TOTAL TIME TAKING CARE OF THIS PATIENT ON DAY OF DISCHARGE: more than 30 minutes.   Molinda Bailiff Garyson Stelly M.D on 04/03/2018 at 12:02 PM  Between 7am to 6pm - Pager - [phone]  After 6pm go to [email or web address] - password [email or web address]  [facility] [place] [facility]  Office  [phone]  CC: Primary care physician; Danella Penton, MD  Note: This dictation was prepared with Dragon dictation along with smaller phrase technology. Any transcriptional errors that result from this process are unintentional.

## 2018-04-04 ENCOUNTER — Inpatient Hospital Stay: Payer: Self-pay | Admitting: Cardiothoracic Surgery

## 2018-04-07 LAB — CULTURE, FUNGUS WITHOUT SMEAR

## 2018-04-11 ENCOUNTER — Inpatient Hospital Stay: Payer: Medicare Other | Admitting: Cardiothoracic Surgery

## 2018-04-28 ENCOUNTER — Ambulatory Visit: Payer: Self-pay | Admitting: Internal Medicine

## 2018-04-30 LAB — ACID FAST CULTURE WITH REFLEXED SENSITIVITIES (MYCOBACTERIA): Acid Fast Culture: NEGATIVE

## 2018-05-02 ENCOUNTER — Other Ambulatory Visit: Payer: Self-pay | Admitting: Internal Medicine

## 2018-05-02 DIAGNOSIS — Z1231 Encounter for screening mammogram for malignant neoplasm of breast: Secondary | ICD-10-CM

## 2018-05-23 ENCOUNTER — Ambulatory Visit
Admission: RE | Admit: 2018-05-23 | Discharge: 2018-05-23 | Disposition: A | Discharge status: Home / Self Care | Payer: Medicare Other | Source: Ambulatory Visit | Attending: Internal Medicine | Admitting: Internal Medicine

## 2018-05-23 DIAGNOSIS — Z1231 Encounter for screening mammogram for malignant neoplasm of breast: Secondary | ICD-10-CM | POA: Insufficient documentation

## 2019-02-14 IMAGING — CT CT CHEST W/ CM
2 of 3 series · 15 of 36 positions shown, 18 images · IV contrast (omnipaque)
Comparison: 03/15/2018

CLINICAL DATA: Follow-up pneumonia, right chest tube

EXAM:
CT CHEST WITH CONTRAST
TECHNIQUE: Multidetector CT imaging of the chest was performed during
intravenous contrast administration.
CONTRAST:  75mL OMNIPAQUE IOHEXOL 300 MG/ML  SOLN

[Series 3: axial st · axial · 0.57mm/px · z∈[-512,-240]mm · 12 of 160 slices shown, 15 images]
[im 12/160  mediastinal]
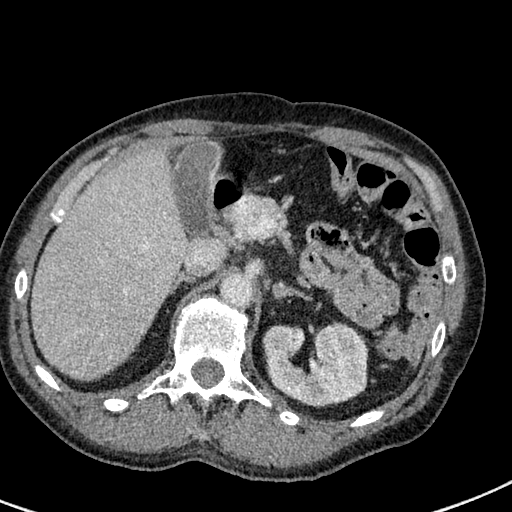
[im 12/160  lung]
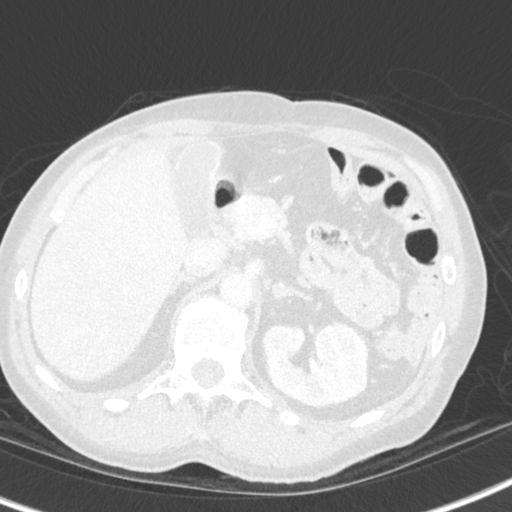
[im 24/160  lung]
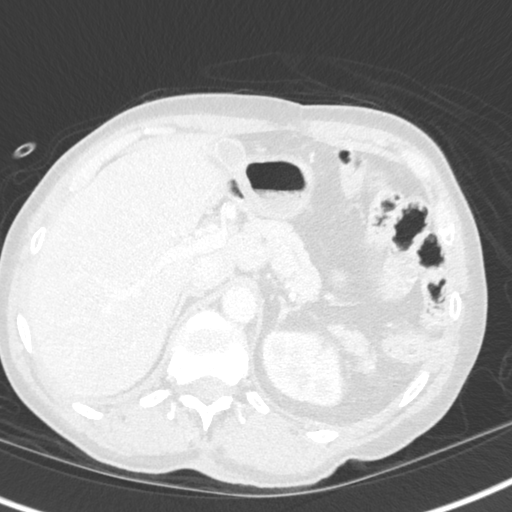
[im 36/160  lung]
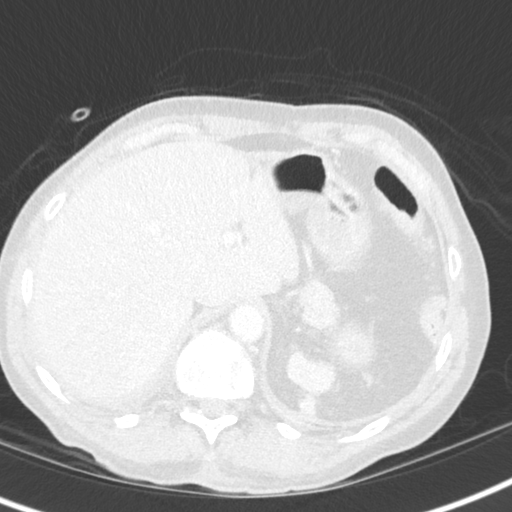
[im 48/160  lung]
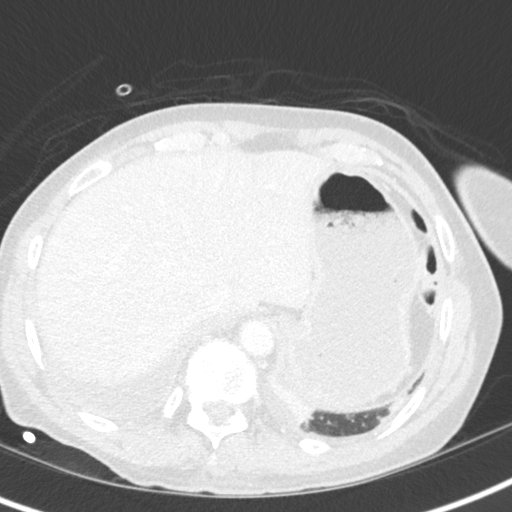
[im 59/160  mediastinal]
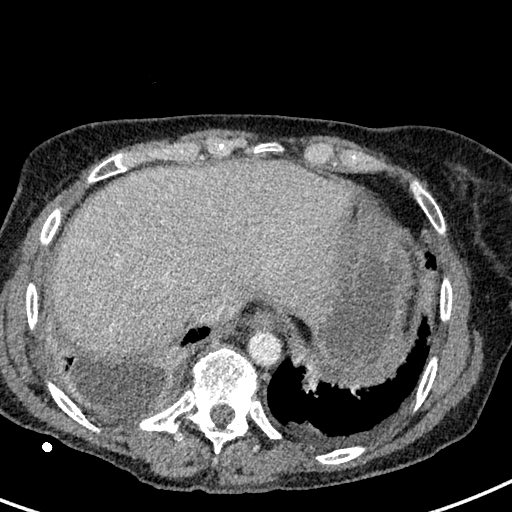
[im 59/160  lung]
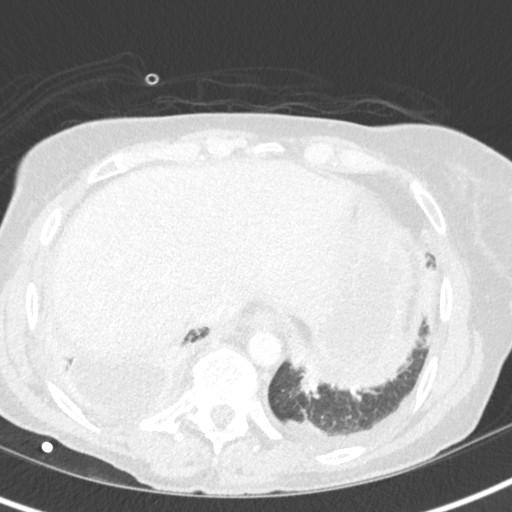
[im 71/160  lung]
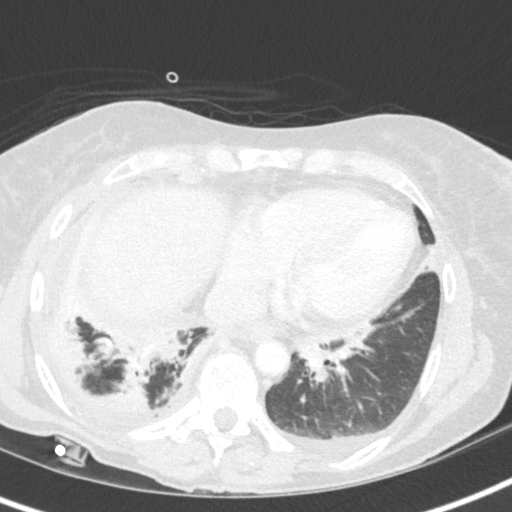
[im 89/160  lung]
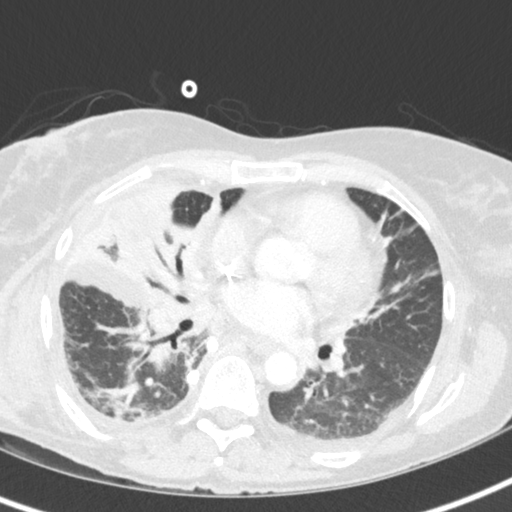
[im 101/160  lung]
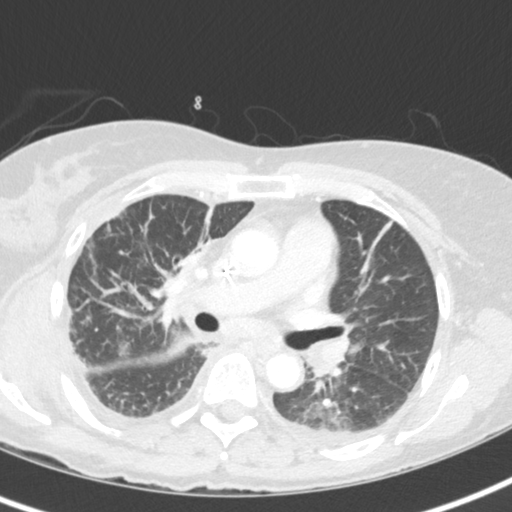
[im 112/160  mediastinal]
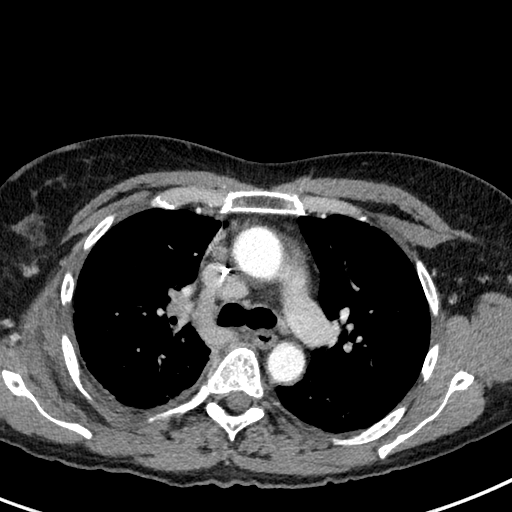
[im 112/160  lung]
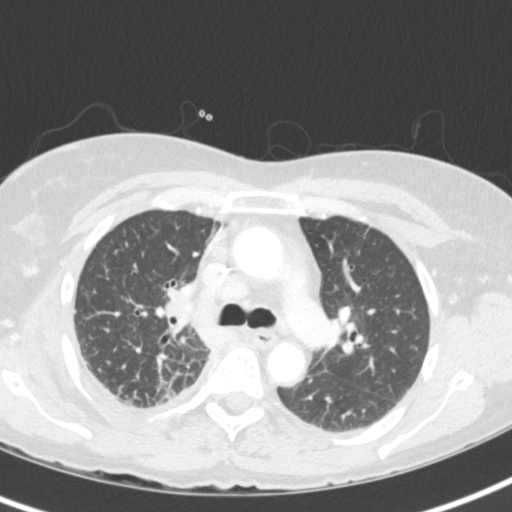
[im 124/160  lung]
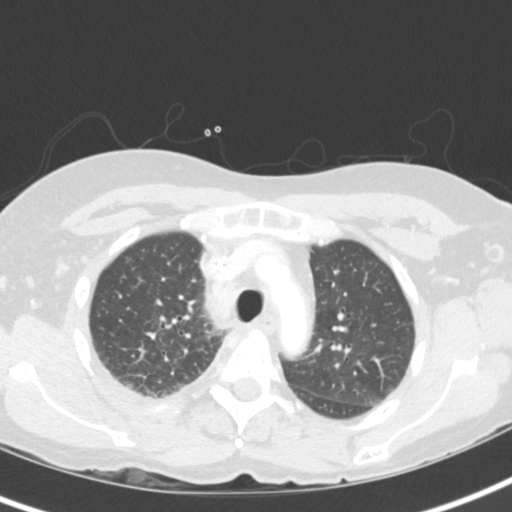
[im 136/160  lung]
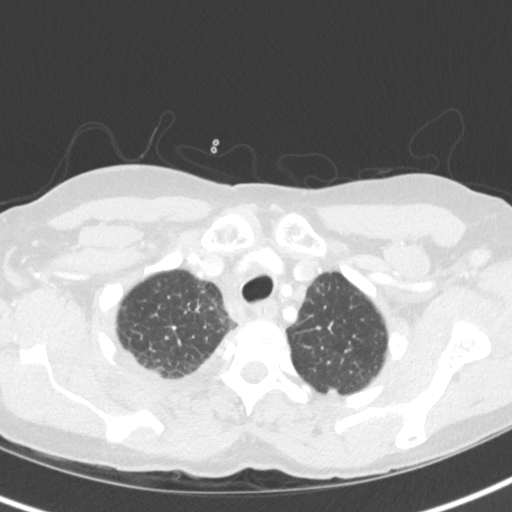
[im 148/160  lung]
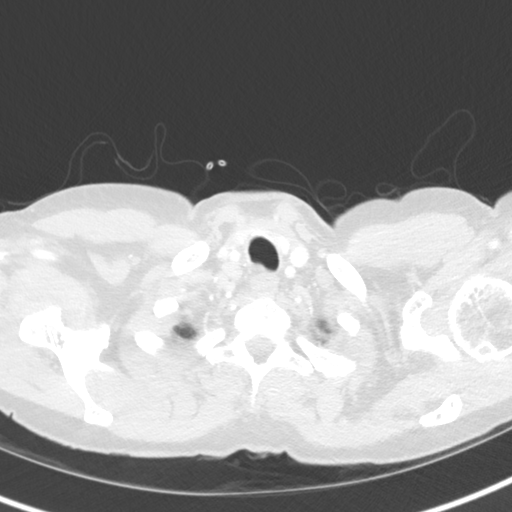

[Series 6: coronal · coronal · 0.57mm/px · 3 of 104 slices shown]
[im 21/104  lung]
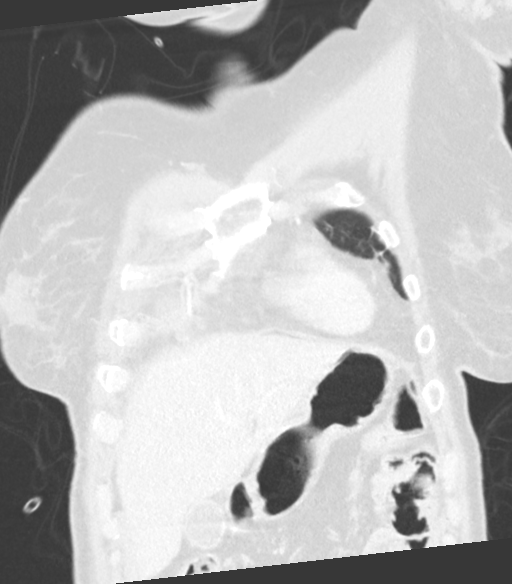
[im 42/104  lung]
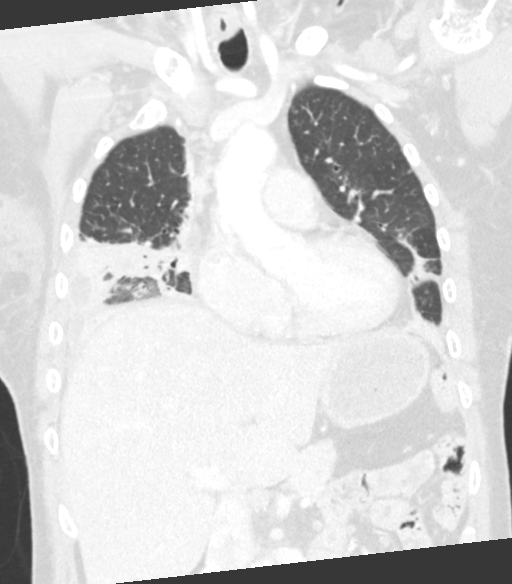
[im 62/104  lung]
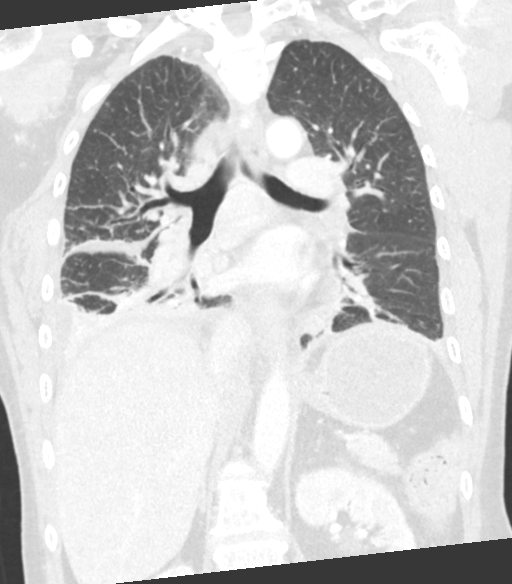

[15 of 36 positions shown; findings below may reference images not displayed]

FINDINGS: Cardiovascular: Heart is normal in size.  No pericardial effusion.

No evidence of thoracic aortic aneurysm.

Right arm PICC terminates at the cavoatrial junction.

Mediastinum/Nodes: Mediastinal lymphadenopathy, including a dominant
1.3 cm short axis low right paratracheal node and a 1.4 cm short
axis subcarinal node, likely reactive.

Visualized thyroid is unremarkable.

Lungs/Pleura: Right middle lobe opacity/consolidation with air
bronchograms, compatible with pneumonia. Additional patchy opacity
in the right lower lobe and to a lesser extent the right upper lobe.

Indwelling pigtail chest tube in the medial right lower hemithorax.
Trace enhancing pleural fluid/empyema overlying the right middle
lobe (series 3/image 39). Additional small right pleural effusion
with enhancing rim posteriorly at the right lung base (series
3/image 106), significantly improved.

Minimal patchy opacity in the left upper lobe/lingula and left lung
base, likely atelectasis, less likely mild infection. Trace left
pleural effusion.

No pneumothorax.

Upper Abdomen: Visualized upper abdomen is grossly unremarkable.

Musculoskeletal: Visualized osseous structures are within normal
limits.
IMPRESSION: Multifocal right lung pneumonia, right middle lobe predominant.

Small right pleural effusion/empyema, decreased, with indwelling
pigtail drainage catheter. No pneumothorax.

Mild mediastinal lymphadenopathy, likely reactive.

Additional ancillary findings as above.

## 2019-12-01 ENCOUNTER — Emergency Department
Admission: EM | Admit: 2019-12-01 | Discharge: 2019-12-01 | Disposition: A | Discharge status: Home / Self Care | Payer: Medicare Other | Attending: Emergency Medicine | Admitting: Emergency Medicine

## 2019-12-01 ENCOUNTER — Other Ambulatory Visit: Payer: Self-pay

## 2019-12-01 DIAGNOSIS — S0083XA Contusion of other part of head, initial encounter: Secondary | ICD-10-CM | POA: Diagnosis not present

## 2019-12-01 DIAGNOSIS — S0081XA Abrasion of other part of head, initial encounter: Secondary | ICD-10-CM | POA: Diagnosis not present

## 2019-12-01 DIAGNOSIS — Z87891 Personal history of nicotine dependence: Secondary | ICD-10-CM | POA: Diagnosis not present

## 2019-12-01 DIAGNOSIS — W19XXXA Unspecified fall, initial encounter: Secondary | ICD-10-CM

## 2019-12-01 DIAGNOSIS — Y92129 Unspecified place in nursing home as the place of occurrence of the external cause: Secondary | ICD-10-CM | POA: Diagnosis not present

## 2019-12-01 DIAGNOSIS — W050XXA Fall from non-moving wheelchair, initial encounter: Secondary | ICD-10-CM | POA: Diagnosis not present

## 2019-12-01 DIAGNOSIS — Y999 Unspecified external cause status: Secondary | ICD-10-CM | POA: Diagnosis not present

## 2019-12-01 DIAGNOSIS — S0990XA Unspecified injury of head, initial encounter: Secondary | ICD-10-CM | POA: Diagnosis present

## 2019-12-01 DIAGNOSIS — Y939 Activity, unspecified: Secondary | ICD-10-CM | POA: Diagnosis not present

## 2019-12-01 NOTE — ED Notes (Signed)
Pt given drink at request. Bacitracin ointment applied to forehead lac and band-aid placed.

## 2019-12-01 NOTE — ED Notes (Signed)
Pt discharged home after verbalizing understanding of discharge instructions; nad noted. 

## 2019-12-01 NOTE — Discharge Instructions (Signed)

## 2019-12-01 NOTE — ED Notes (Signed)
Pt rang call bell and stated that she is ready to go and wants to walk home. Pt is very frustrated at being here and wants to leave. Pt encouraged to stay until EMS arrives to take her back.

## 2019-12-01 NOTE — ED Triage Notes (Addendum)
Pt arrives [facility] from [facility] w cc of fall. Pt normally uses wheelchair to get around. Pt was sitting in wheelchair putting shoes on. She states "my pants are slick and I slipped out of the wheelchair while bending over to put my shoes on". Pt has laceration to forehead that she states is an old wound that reopened when she hit her head on the wall when falling. Pt denies LOC.

## 2019-12-01 NOTE — ED Provider Notes (Signed)
[facility] Emergency Department Provider Note  ____________________________________________   First MD Initiated Contact with Patient 12/01/19 0448     (approximate)  I have reviewed the triage vital signs and the nursing notes.   HISTORY  Chief Complaint Fall    HPI Brittany [name] is a 69 y.o. 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  In spite of her prior injuries, she appears cognitively intact and is able to provide a thorough history.   She reports that she accidentally slid out of her wheelchair while bending over to fix her shoes and she fell, striking her forehead on the wall which reopened an old wound to the left side of her forehead.  She did not lose consciousness.  She has no headache, no neck pain, no chest pain, no shortness of breath.  She has no injuries to her arms or her legs.  She said that she is at her baseline and wants to go home.  She tried to get most of the staff not to send her to the emergency department but as per their protocol they were required to do so.  Incident occurred acutely and it was mild.  Nothing in particular makes her symptoms better or worse.        Past Medical History:  Diagnosis Date  . Speech impairment   . TBI (traumatic brain injury) [facility])     Patient Active Problem List   Diagnosis Date Noted  . Pleural effusion on right   . Community acquired pneumonia 03/13/2018  . Pneumonia 03/13/2018    Past Surgical History:  Procedure Laterality Date  . BREAST EXCISIONAL BIOPSY Left 1992   neg  . SPLENECTOMY, TOTAL      Prior to Admission medications   Medication Sig Start Date End Date Taking? Authorizing Provider  amoxicillin-clavulanate (AUGMENTIN) 875-125 MG tablet Take 1 tablet by mouth 2 (two) times daily. 03/23/18   Milagros Loll, MD  diazepam (VALIUM) 10 MG tablet Take 10 mg by mouth at  bedtime as needed.    [provider]  pseudoephedrine (SUDAFED) 30 MG tablet Take 60 mg by mouth 2 (two) times daily as needed for congestion.    [provider]    Allergies Sulfa antibiotics  Family History  Problem Relation Age of Onset  . Hypertension Mother   . Breast cancer Neg Hx     Social History Social History   Tobacco Use  . Smoking status: Former Games developer  . Smokeless tobacco: Never Used  Substance Use Topics  . Alcohol use: Never  . Drug use: Never    Review of Systems Constitutional: No fever/chills Eyes: No visual changes. ENT: No sore throat. Cardiovascular: Denies chest pain. Respiratory: Denies shortness of breath. Gastrointestinal: No abdominal pain.  No nausea, no vomiting.  No diarrhea.  No constipation. Genitourinary: Negative for dysuria. Musculoskeletal: Negative for neck pain.  Negative for back pain. Integumentary: Negative for rash. Neurological: Negative for headaches, focal weakness or numbness.   ____________________________________________   PHYSICAL EXAM:  VITAL SIGNS: ED Triage Vitals  Enc Vitals Group     BP 12/01/19 0440 99/71     Pulse Rate 12/01/19 0440 76     Resp 12/01/19 0440 18     Temp 12/01/19 0440 98.2 F (36.8 C)     Temp Source 12/01/19 0440 Oral     SpO2 12/01/19 0440 97 %  Weight 12/01/19 0441 61.8 kg (136 lb 3.9 oz)     Height 12/01/19 0441 1.626 m (5\' 4" )     Head Circumference --      Peak Flow --      Pain Score 12/01/19 0441 0     Pain Loc --      Pain Edu? --      Excl. in GC? --     Constitutional: Alert and oriented.  Chronic impairment as described above but without any acute distress. Eyes: Conjunctivae are normal.  Head: Acute versus subacute contusion and abrasion to the left side of her forehead. Nose: No congestion/rhinnorhea. Mouth/Throat: Patient is wearing a mask. Neck: No stridor.  No meningeal signs.   Cardiovascular: Normal rate, regular rhythm. Good peripheral  circulation. Grossly normal heart sounds. Respiratory: Normal respiratory effort.  No retractions. Gastrointestinal: Soft and nontender. No distention.  Musculoskeletal: No tenderness to palpation of the cervical spine and no pain with flexion or extension or rotation side to side.  No lower extremity tenderness nor edema. No gross deformities of extremities. Neurologic: Patient has some baseline neurological deficits including speech impairment and some impaired use of her legs and arms, but she reports that they are at her baseline and there is no evidence of any acute neurological issues. Skin:  Skin is warm, dry and intact.  ____________________________________________   LABS (all labs ordered are listed, but only abnormal results are displayed)  Labs Reviewed - No data to display ____________________________________________  EKG  No indication for EKG ____________________________________________  RADIOLOGY I, 01/31/20, personally viewed and evaluated these images (plain radiographs) as part of my medical decision making, as well as reviewing the written report by the radiologist.  ED MD interpretation: No indication for emergent imaging  Official radiology report(s): No results found.  ____________________________________________   PROCEDURES   Procedure(s) performed (including Critical Care):  Procedures   ____________________________________________   INITIAL IMPRESSION / MDM / ASSESSMENT AND PLAN / ED COURSE  As part of my medical decision making, I reviewed the following data within the electronic MEDICAL RECORD NUMBER Nursing notes reviewed and incorporated, Old chart reviewed and Notes from prior ED visits   Differential diagnosis includes, but is not limited to, minor fall and minor head injury, abrasion, intracranial bleeding, cervical spine injury.  The patient is well-appearing in spite of her chronic issues and in no distress.  She is cognitively intact  and able to give a thorough history.  She has says that she is not in pain and has no injuries and her physical exam supports this.  We dressed the wound on her forehead.  She does not want any further work-up and I agree it is not necessary.  I will discharge her back to her facility for outpatient follow-up as needed.  She agrees with the plan.         ____________________________________________  FINAL CLINICAL IMPRESSION(S) / ED DIAGNOSES  Final diagnoses:  Fall, initial encounter  Contusion of forehead, initial encounter  Abrasion of forehead, initial encounter     MEDICATIONS GIVEN DURING THIS VISIT:  Medications - No data to display   ED Discharge Orders    None      *Please note:  Brittany [name] was evaluated in Emergency Department on 12/01/2019 for the symptoms described in the history of present illness. She was evaluated in the context of the global COVID-19 pandemic, which necessitated consideration that the patient might be at risk for infection with the SARS-CoV-2  virus that causes COVID-19. Institutional protocols and algorithms that pertain to the evaluation of patients at risk for COVID-19 are in a state of rapid change based on information released by regulatory bodies including the CDC and federal and state organizations. These policies and algorithms were followed during the patient's care in the ED.  Some ED evaluations and interventions may be delayed as a result of limited staffing during the pandemic.*  Note:  This document was prepared using Dragon voice recognition software and may include unintentional dictation errors.   Hinda Kehr, MD 12/01/19 [phone]

## 2019-12-01 NOTE — ED Notes (Signed)
Pt still asking to leave; called [facility] to see if they could pick her up. They are going to call to see if EMS got here after they drop another resident at dialysis. If not, they will pick pt up.

## 2020-02-16 ENCOUNTER — Other Ambulatory Visit: Payer: Self-pay

## 2020-02-16 ENCOUNTER — Encounter: Payer: Self-pay | Admitting: Emergency Medicine

## 2020-02-16 ENCOUNTER — Emergency Department
Admission: EM | Admit: 2020-02-16 | Discharge: 2020-02-16 | Disposition: A | Discharge status: Home / Self Care | Payer: Medicare Other | Attending: Emergency Medicine | Admitting: Emergency Medicine

## 2020-02-16 DIAGNOSIS — F918 Other conduct disorders: Secondary | ICD-10-CM | POA: Diagnosis present

## 2020-02-16 DIAGNOSIS — R4689 Other symptoms and signs involving appearance and behavior: Secondary | ICD-10-CM

## 2020-02-16 LAB — URINALYSIS, ROUTINE W REFLEX MICROSCOPIC
Bacteria, UA: NONE SEEN
Bilirubin Urine: NEGATIVE
Glucose, UA: NEGATIVE mg/dL
Hgb urine dipstick: NEGATIVE
Ketones, ur: NEGATIVE mg/dL
Leukocytes,Ua: NEGATIVE
Nitrite: NEGATIVE
Protein, ur: NEGATIVE mg/dL
Specific Gravity, Urine: 1.012 (ref 1.005–1.030)
pH: 6 (ref 5.0–8.0)

## 2020-02-16 MED ORDER — LORAZEPAM 0.5 MG PO TABS
0.5000 mg | ORAL_TABLET | Freq: Two times a day (BID) | ORAL | 0 refills | Status: AC
Start: 2020-02-16 — End: 2021-02-15

## 2020-02-16 NOTE — ED Notes (Signed)
Patient calls out asking when she can go home.  Says she wants to go home.  She is in nad.

## 2020-02-16 NOTE — ED Notes (Signed)
[facility] called and say patient will be picked up by aunt.

## 2020-02-16 NOTE — ED Notes (Signed)
Patient is up for discharge.  She is aware. I have called [facility] and they will call me back as they are in with a patient right now.
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
## 2020-02-16 NOTE — ED Notes (Signed)
Her aunt Brittany [name] came and picked patient up to take her back to [facility].  She will pick up prescription at [facility].

## 2020-03-09 ENCOUNTER — Emergency Department
Admission: EM | Admit: 2020-03-09 | Discharge: 2020-03-09 | Disposition: A | Discharge status: Home / Self Care | Payer: Medicare Other | Attending: Emergency Medicine | Admitting: Emergency Medicine

## 2020-03-09 ENCOUNTER — Other Ambulatory Visit: Payer: Self-pay

## 2020-03-09 DIAGNOSIS — Z87891 Personal history of nicotine dependence: Secondary | ICD-10-CM | POA: Insufficient documentation

## 2020-03-09 DIAGNOSIS — Y999 Unspecified external cause status: Secondary | ICD-10-CM | POA: Diagnosis not present

## 2020-03-09 DIAGNOSIS — Y929 Unspecified place or not applicable: Secondary | ICD-10-CM | POA: Insufficient documentation

## 2020-03-09 DIAGNOSIS — R455 Hostility: Secondary | ICD-10-CM

## 2020-03-09 DIAGNOSIS — S069X0D Unspecified intracranial injury without loss of consciousness, subsequent encounter: Secondary | ICD-10-CM

## 2020-03-09 DIAGNOSIS — R454 Irritability and anger: Secondary | ICD-10-CM | POA: Diagnosis not present

## 2020-03-09 DIAGNOSIS — X58XXXA Exposure to other specified factors, initial encounter: Secondary | ICD-10-CM | POA: Insufficient documentation

## 2020-03-09 DIAGNOSIS — S069X0A Unspecified intracranial injury without loss of consciousness, initial encounter: Secondary | ICD-10-CM | POA: Diagnosis present

## 2020-03-09 DIAGNOSIS — R456 Violent behavior: Secondary | ICD-10-CM | POA: Diagnosis not present

## 2020-03-09 DIAGNOSIS — Y939 Activity, unspecified: Secondary | ICD-10-CM | POA: Insufficient documentation
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
## 2020-03-09 NOTE — Discharge Instructions (Signed)
Please continue your normal care at home.  If you develop any acute illnesses or concerns, please return to the ED.

## 2022-04-11 ENCOUNTER — Emergency Department: Payer: Self-pay

## 2022-04-11 ENCOUNTER — Encounter: Payer: Self-pay | Admitting: Emergency Medicine

## 2022-04-11 ENCOUNTER — Other Ambulatory Visit: Payer: Self-pay

## 2022-04-11 ENCOUNTER — Emergency Department
Admission: EM | Admit: 2022-04-11 | Discharge: 2022-04-11 | Disposition: A | Discharge status: Home / Self Care | Payer: Self-pay | Attending: Emergency Medicine | Admitting: Emergency Medicine

## 2022-04-11 DIAGNOSIS — W19XXXA Unspecified fall, initial encounter: Secondary | ICD-10-CM

## 2022-04-11 DIAGNOSIS — S0181XA Laceration without foreign body of other part of head, initial encounter: Secondary | ICD-10-CM

## 2022-04-11 DIAGNOSIS — S0101XA Laceration without foreign body of scalp, initial encounter: Secondary | ICD-10-CM

## 2022-04-11 NOTE — ED Provider Notes (Signed)
[facility] Provider Note    Event Date/Time   First MD Initiated Contact with Patient 04/11/22 [phone]     (approximate)   History   Fall and Head Injury   HPI  Brittany [name] is a 71 y.o. female   presents to the ED via EMS from [facility] facility.  Patient reportedly fell out of her wheelchair as a witnessed fall and there was no loss of consciousness.  Patient has a laceration to the left side of her head and does not take blood thinners.  Patient has a history of TBI and speech impairment.      Physical Exam   Triage Vital Signs: ED Triage Vitals [04/11/22 0856]  Enc Vitals Group     BP (!) 101/59     Pulse Rate 69     Resp 16     Temp 98.2 F (36.8 C)     Temp Source Oral     SpO2 96 %     Weight 127 lb 13.9 oz (58 kg)     Height 5\' 4"  (1.626 m)     Head Circumference      Peak Flow      Pain Score      Pain Loc      Pain Edu?      Excl. in [place]?     Most recent vital signs: Vitals:   04/11/22 0856 04/11/22 1244  BP: (!) 101/59 123/71  Pulse: 69 68  Resp: 16 18  Temp: 98.2 F (36.8 C) 97.8 F (36.6 C)  SpO2: 96% 100%     General: Awake, no distress.  Talkative, cooperative. CV:  Good peripheral perfusion.  Resp:  Normal effort.  Abd:  No distention.  Other:  There is a 1 cm superficial laceration to the left lateral scalp without active bleeding and a 0.3 cm superficial laceration left facial area without active bleeding or foreign body.  No sutures are required to the facial laceration and area was cleaned with saline.   ED Results / Procedures / Treatments   Labs (all labs ordered are listed, but only abnormal results are displayed) Labs Reviewed - No data to display    RADIOLOGY  CT head without contrast per radiologist is negative for any acute intracranial changes.  Atrophy present.   PROCEDURES:  Critical Care performed:   Marland KitchenMarland KitchenLaceration Repair  Date/Time: 04/11/2022 11:22 PM  Performed by: Johnn Hai, PA-C Authorized by: Johnn Hai, PA-C   Consent:    Consent obtained:  Verbal   Consent given by:  Patient   Risks discussed:  Infection and pain Universal protocol:    Patient identity confirmed:  Verbally with patient and arm band Anesthesia:    Anesthesia method:  None Laceration details:    Location:  Scalp   Scalp location:  L temporal   Length (cm):  1 Exploration:    Limited defect created (wound extended): no     Hemostasis achieved with:  Direct pressure   Imaging outcome: foreign body not noted     Contaminated: no   Treatment:    Area cleansed with:  Saline   Amount of cleaning:  Standard   Irrigation solution:  Sterile saline   Irrigation volume:  60 ml   Irrigation method:  Syringe and tap   Visualized foreign bodies/material removed: no     Debridement:  None Skin repair:    Repair method:  Tissue adhesive Approximation:  Approximation:  Close Repair type:    Repair type:  Simple Post-procedure details:    Dressing:  Open (no dressing)   Procedure completion:  Tolerated 1.0   MEDICATIONS ORDERED IN ED: Medications - No data to display   IMPRESSION / MDM / [place] / ED COURSE  I reviewed the triage vital signs and the nursing notes.   Differential diagnosis includes, but is not limited to, skull fracture, brain injury, laceration scalp,  71 year old female presents to the ED with laceration to her left scalp and also a very minute open area left facial area without active bleeding or foreign body noted.  CT scan was obtained as patient reportedly slid out of her wheelchair at the nursing facility.  Patient has remained verbal and cooperative while in the ED.  CT scan was reassuring and was negative for any acute changes.  Dermabond was used to close the laceration on the left scalp which patient tolerated well.  She was given instructions to take back with her and arrangements are being made for her to be transported back by  EMS.      Patient's presentation is most consistent with acute complicated illness / injury requiring diagnostic workup.  FINAL CLINICAL IMPRESSION(S) / ED DIAGNOSES   Final diagnoses:  Laceration of scalp, initial encounter  Facial laceration, initial encounter  Fall, initial encounter     Rx / DC Orders   ED Discharge Orders     None        Note:  This document was prepared using Dragon voice recognition software and may include unintentional dictation errors.   Johnn Hai, PA-C 04/11/22 1436    Nena Polio, MD 04/11/22 1600

## 2022-04-11 NOTE — ED Triage Notes (Signed)
Pt with difficulty communicating. States fell, denies pain. See EMS report

## 2022-04-11 NOTE — ED Notes (Signed)
Attempted to call report to [facility], but could not be connected to staff. EMS has been called for transport.

## 2022-04-11 NOTE — Discharge Instructions (Signed)
Have her follow-up with her primary care provider if any continued problems.  CT scan is negative for any acute intracranial injury.  The laceration to the left scalp was sealed with Dermabond and does not need any further treatment.  The area on her face was left open to be clean daily with mild soap and water and watch for any signs of infection.

## 2022-04-11 NOTE — ED Triage Notes (Addendum)
Pt in via EMS from [facility]. Per facility, pt fell out of wheelchair, no LOC. Pt with laceration to left side of head. Does not take blood thinners. 120/65, HR 77, 94% RA

## 2022-04-11 NOTE — ED Notes (Signed)
Gave pt two apple sauce and soda per provider.

## 2022-04-11 NOTE — ED Notes (Signed)
Called [facility] for transport to [facility]  1236
